# Patient Record
Sex: Female | Born: 1945 | Race: Black or African American | Marital: Single
Health system: Southern US, Community
[De-identification: ages and names within clinical notes are randomized; demographics above are authoritative.]

## PROBLEM LIST (undated history)

## (undated) DIAGNOSIS — R6250 Unspecified lack of expected normal physiological development in childhood: Secondary | ICD-10-CM

## (undated) DIAGNOSIS — I10 Essential (primary) hypertension: Secondary | ICD-10-CM

## (undated) DIAGNOSIS — C50919 Malignant neoplasm of unspecified site of unspecified female breast: Secondary | ICD-10-CM

## (undated) DIAGNOSIS — E118 Type 2 diabetes mellitus with unspecified complications: Secondary | ICD-10-CM

## (undated) DIAGNOSIS — E213 Hyperparathyroidism, unspecified: Secondary | ICD-10-CM

## (undated) DIAGNOSIS — E785 Hyperlipidemia, unspecified: Secondary | ICD-10-CM

## (undated) DIAGNOSIS — E1165 Type 2 diabetes mellitus with hyperglycemia: Secondary | ICD-10-CM

## (undated) DIAGNOSIS — IMO0002 Reserved for concepts with insufficient information to code with codable children: Secondary | ICD-10-CM

## (undated) DIAGNOSIS — K5900 Constipation, unspecified: Secondary | ICD-10-CM

## (undated) DIAGNOSIS — H9190 Unspecified hearing loss, unspecified ear: Secondary | ICD-10-CM

## (undated) DIAGNOSIS — F0390 Unspecified dementia without behavioral disturbance: Secondary | ICD-10-CM

## (undated) HISTORY — PX: OTHER SURGICAL HISTORY: SHX169

---

## 2018-09-15 ENCOUNTER — Encounter: Payer: Self-pay | Admitting: Nurse Practitioner

## 2018-09-15 ENCOUNTER — Other Ambulatory Visit: Payer: Self-pay

## 2018-09-15 ENCOUNTER — Inpatient Hospital Stay
Admission: AD | Admit: 2018-09-15 | Discharge: 2018-09-22 | DRG: 638 | Disposition: A | Discharge status: Skilled Nursing Facility | Payer: Medicare (Managed Care) | Source: Ambulatory Visit | Attending: Internal Medicine | Admitting: Internal Medicine

## 2018-09-15 DIAGNOSIS — K567 Ileus, unspecified: Principal | ICD-10-CM

## 2018-09-15 DIAGNOSIS — R739 Hyperglycemia, unspecified: Secondary | ICD-10-CM

## 2018-09-15 DIAGNOSIS — E11649 Type 2 diabetes mellitus with hypoglycemia without coma: Secondary | ICD-10-CM | POA: Diagnosis present

## 2018-09-15 DIAGNOSIS — H9193 Unspecified hearing loss, bilateral: Secondary | ICD-10-CM | POA: Diagnosis present

## 2018-09-15 DIAGNOSIS — Z9011 Acquired absence of right breast and nipple: Secondary | ICD-10-CM

## 2018-09-15 DIAGNOSIS — R6250 Unspecified lack of expected normal physiological development in childhood: Secondary | ICD-10-CM | POA: Diagnosis present

## 2018-09-15 DIAGNOSIS — E785 Hyperlipidemia, unspecified: Secondary | ICD-10-CM | POA: Diagnosis present

## 2018-09-15 DIAGNOSIS — H409 Unspecified glaucoma: Secondary | ICD-10-CM | POA: Diagnosis present

## 2018-09-15 DIAGNOSIS — E1165 Type 2 diabetes mellitus with hyperglycemia: Principal | ICD-10-CM | POA: Diagnosis present

## 2018-09-15 DIAGNOSIS — N179 Acute kidney failure, unspecified: Secondary | ICD-10-CM | POA: Diagnosis present

## 2018-09-15 DIAGNOSIS — I10 Essential (primary) hypertension: Secondary | ICD-10-CM | POA: Diagnosis present

## 2018-09-15 DIAGNOSIS — R809 Proteinuria, unspecified: Secondary | ICD-10-CM | POA: Diagnosis present

## 2018-09-15 DIAGNOSIS — E871 Hypo-osmolality and hyponatremia: Secondary | ICD-10-CM | POA: Diagnosis present

## 2018-09-15 DIAGNOSIS — N2889 Other specified disorders of kidney and ureter: Secondary | ICD-10-CM | POA: Diagnosis present

## 2018-09-15 DIAGNOSIS — E876 Hypokalemia: Secondary | ICD-10-CM | POA: Diagnosis present

## 2018-09-15 DIAGNOSIS — Z888 Allergy status to other drugs, medicaments and biological substances status: Secondary | ICD-10-CM

## 2018-09-15 DIAGNOSIS — M48061 Spinal stenosis, lumbar region without neurogenic claudication: Secondary | ICD-10-CM | POA: Diagnosis present

## 2018-09-15 DIAGNOSIS — Z853 Personal history of malignant neoplasm of breast: Secondary | ICD-10-CM | POA: Diagnosis not present

## 2018-09-15 DIAGNOSIS — F0390 Unspecified dementia without behavioral disturbance: Secondary | ICD-10-CM | POA: Diagnosis present

## 2018-09-15 DIAGNOSIS — Z1159 Encounter for screening for other viral diseases: Secondary | ICD-10-CM | POA: Diagnosis not present

## 2018-09-15 DIAGNOSIS — Z79899 Other long term (current) drug therapy: Secondary | ICD-10-CM

## 2018-09-15 DIAGNOSIS — Z90710 Acquired absence of both cervix and uterus: Secondary | ICD-10-CM | POA: Diagnosis not present

## 2018-09-15 DIAGNOSIS — R627 Adult failure to thrive: Secondary | ICD-10-CM | POA: Diagnosis present

## 2018-09-15 DIAGNOSIS — K219 Gastro-esophageal reflux disease without esophagitis: Secondary | ICD-10-CM | POA: Diagnosis present

## 2018-09-15 DIAGNOSIS — K8020 Calculus of gallbladder without cholecystitis without obstruction: Secondary | ICD-10-CM | POA: Diagnosis present

## 2018-09-15 DIAGNOSIS — E210 Primary hyperparathyroidism: Secondary | ICD-10-CM | POA: Diagnosis present

## 2018-09-15 DIAGNOSIS — Z9181 History of falling: Secondary | ICD-10-CM

## 2018-09-15 DIAGNOSIS — F89 Unspecified disorder of psychological development: Secondary | ICD-10-CM | POA: Diagnosis present

## 2018-09-15 DIAGNOSIS — E878 Other disorders of electrolyte and fluid balance, not elsewhere classified: Secondary | ICD-10-CM | POA: Diagnosis present

## 2018-09-15 DIAGNOSIS — R269 Unspecified abnormalities of gait and mobility: Secondary | ICD-10-CM | POA: Diagnosis not present

## 2018-09-15 HISTORY — DX: Unspecified lack of expected normal physiological development in childhood: R62.50

## 2018-09-15 HISTORY — DX: Type 2 diabetes mellitus with unspecified complications: E11.8

## 2018-09-15 HISTORY — DX: Hyperlipidemia, unspecified: E78.5

## 2018-09-15 HISTORY — DX: Hyperparathyroidism, unspecified: E21.3

## 2018-09-15 HISTORY — DX: Essential (primary) hypertension: I10

## 2018-09-15 HISTORY — DX: Malignant neoplasm of unspecified site of unspecified female breast: C50.919

## 2018-09-15 HISTORY — DX: Type 2 diabetes mellitus with hyperglycemia: E11.65

## 2018-09-15 HISTORY — DX: Reserved for concepts with insufficient information to code with codable children: IMO0002

## 2018-09-15 LAB — URINALYSIS, COMPLETE (UACMP) WITH MICROSCOPIC
Bacteria, UA: NONE SEEN
Bilirubin Urine: NEGATIVE
Glucose, UA: >=500 mg/dL — AB
Hgb urine dipstick: NEGATIVE
Ketones, ur: 80 mg/dL — AB
Leukocytes,Ua: NEGATIVE
Nitrite: NEGATIVE
Protein, ur: NEGATIVE mg/dL
Specific Gravity, Urine: 1.014 (ref 1.005–1.030)
pH: 5 (ref 5.0–8.0)

## 2018-09-15 LAB — CBC WITH DIFFERENTIAL/PLATELET
Abs Immature Granulocytes: 0.1 10*3/uL — ABNORMAL HIGH (ref 0.00–0.07)
Basophils Absolute: 0 10*3/uL (ref 0.0–0.1)
Basophils Relative: 1 %
Eosinophils Absolute: 0.1 10*3/uL (ref 0.0–0.5)
Eosinophils Relative: 2 %
HCT: 40.9 % (ref 36.0–46.0)
Hemoglobin: 13 g/dL (ref 12.0–15.0)
Immature Granulocytes: 1 %
Lymphocytes Relative: 35 %
Lymphs Abs: 2.6 10*3/uL (ref 0.7–4.0)
MCH: 26.3 pg (ref 26.0–34.0)
MCHC: 31.8 g/dL (ref 30.0–36.0)
MCV: 82.8 fL (ref 80.0–100.0)
Monocytes Absolute: 0.8 10*3/uL (ref 0.1–1.0)
Monocytes Relative: 11 %
Neutro Abs: 3.8 10*3/uL (ref 1.7–7.7)
Neutrophils Relative %: 50 %
Platelets: 295 10*3/uL (ref 150–400)
RBC: 4.94 MIL/uL (ref 3.87–5.11)
RDW: 13.8 % (ref 11.5–15.5)
WBC: 7.5 10*3/uL (ref 4.0–10.5)
nRBC: 0 % (ref 0.0–0.2)

## 2018-09-15 LAB — COMPREHENSIVE METABOLIC PANEL
ALT: 19 U/L (ref 0–44)
AST: 16 U/L (ref 15–41)
Albumin: 4 g/dL (ref 3.5–5.0)
Alkaline Phosphatase: 79 U/L (ref 38–126)
Anion gap: 23 — ABNORMAL HIGH (ref 5–15)
BUN: 23 mg/dL (ref 8–23)
CO2: 16 mmol/L — ABNORMAL LOW (ref 22–32)
Calcium: 10.9 mg/dL — ABNORMAL HIGH (ref 8.9–10.3)
Chloride: 94 mmol/L — ABNORMAL LOW (ref 98–111)
Creatinine, Ser: 1.52 mg/dL — ABNORMAL HIGH (ref 0.44–1.00)
GFR calc Af Amer: 39 mL/min — ABNORMAL LOW (ref >60)
GFR calc non Af Amer: 34 mL/min — ABNORMAL LOW (ref >60)
Glucose, Bld: 385 mg/dL — ABNORMAL HIGH (ref 70–99)
Potassium: 4.6 mmol/L (ref 3.5–5.1)
Sodium: 133 mmol/L — ABNORMAL LOW (ref 135–145)
Total Bilirubin: 1.5 mg/dL — ABNORMAL HIGH (ref 0.3–1.2)
Total Protein: 7.1 g/dL (ref 6.5–8.1)

## 2018-09-15 LAB — MAGNESIUM: Magnesium: 1.7 mg/dL (ref 1.7–2.4)

## 2018-09-15 LAB — GLUCOSE, CAPILLARY
Glucose-Capillary: 353 mg/dL — ABNORMAL HIGH (ref 70–99)
Glucose-Capillary: 406 mg/dL — ABNORMAL HIGH (ref 70–99)
Glucose-Capillary: 328 mg/dL — ABNORMAL HIGH (ref 70–99)

## 2018-09-15 LAB — TSH: TSH: 0.646 u[IU]/mL (ref 0.350–4.500)

## 2018-09-15 MED ORDER — PANTOPRAZOLE SODIUM 40 MG PO TBEC
40.0000 mg | DELAYED_RELEASE_TABLET | Freq: Every day | ORAL | Status: DC
  Administered 2018-09-16 – 2018-09-22 (×7): 40 mg via ORAL
  Filled 2018-09-15 (×7): qty 1

## 2018-09-15 MED ORDER — MAGNESIUM OXIDE 400 (241.3 MG) MG PO TABS
400.0000 mg | ORAL_TABLET | Freq: Two times a day (BID) | ORAL | Status: DC
  Administered 2018-09-15 – 2018-09-22 (×14): 400 mg via ORAL
  Filled 2018-09-15 (×14): qty 1

## 2018-09-15 MED ORDER — INSULIN ASPART 100 UNIT/ML ~~LOC~~ SOLN: 0.0000 [IU] | Freq: Three times a day (TID) | SUBCUTANEOUS | Status: DC

## 2018-09-15 MED ORDER — METFORMIN HCL 500 MG PO TABS
1000.0000 mg | ORAL_TABLET | Freq: Two times a day (BID) | ORAL | Status: DC
  Administered 2018-09-16 – 2018-09-22 (×12): 1000 mg via ORAL
  Filled 2018-09-15 (×15): qty 2

## 2018-09-15 MED ORDER — ASPIRIN EC 81 MG PO TBEC
81.0000 mg | DELAYED_RELEASE_TABLET | Freq: Every day | ORAL | Status: DC
  Administered 2018-09-16 – 2018-09-22 (×7): 81 mg via ORAL
  Filled 2018-09-15 (×7): qty 1

## 2018-09-15 MED ORDER — LATANOPROST 0.005 % OP SOLN
1.0000 [drp] | Freq: Every day | OPHTHALMIC | Status: DC
  Administered 2018-09-15 – 2018-09-21 (×7): 1 [drp] via OPHTHALMIC
  Filled 2018-09-15: qty 2.5

## 2018-09-15 MED ORDER — VITAMIN D 25 MCG (1000 UNIT) PO TABS
1000.0000 [IU] | ORAL_TABLET | Freq: Every day | ORAL | Status: DC
  Administered 2018-09-16 – 2018-09-22 (×7): 1000 [IU] via ORAL
  Filled 2018-09-15 (×7): qty 1

## 2018-09-15 MED ORDER — INSULIN ASPART 100 UNIT/ML ~~LOC~~ SOLN
0.0000 [IU] | Freq: Three times a day (TID) | SUBCUTANEOUS | Status: DC
  Administered 2018-09-15 – 2018-09-16 (×2): 11 [IU] via SUBCUTANEOUS
  Administered 2018-09-16: 12:00:00 15 [IU] via SUBCUTANEOUS
  Administered 2018-09-16: 3 [IU] via SUBCUTANEOUS
  Administered 2018-09-17: 22:00:00 5 [IU] via SUBCUTANEOUS
  Administered 2018-09-17: 12:00:00 8 [IU] via SUBCUTANEOUS
  Administered 2018-09-17: 15 [IU] via SUBCUTANEOUS
  Administered 2018-09-17: 5 [IU] via SUBCUTANEOUS
  Administered 2018-09-18: 17:00:00 8 [IU] via SUBCUTANEOUS
  Administered 2018-09-18: 13:00:00 15 [IU] via SUBCUTANEOUS
  Administered 2018-09-18: 09:00:00 11 [IU] via SUBCUTANEOUS
  Filled 2018-09-15 (×11): qty 1

## 2018-09-15 MED ORDER — LOSARTAN POTASSIUM 25 MG PO TABS
25.0000 mg | ORAL_TABLET | Freq: Every day | ORAL | Status: DC
  Administered 2018-09-17 – 2018-09-22 (×5): 25 mg via ORAL
  Filled 2018-09-15 (×7): qty 1

## 2018-09-15 MED ORDER — POLYETHYLENE GLYCOL 3350 17 G PO PACK
17.0000 g | PACK | Freq: Every day | ORAL | Status: DC
  Administered 2018-09-16 – 2018-09-22 (×7): 17 g via ORAL
  Filled 2018-09-15 (×7): qty 1

## 2018-09-15 MED ORDER — SODIUM CHLORIDE 0.9 % IV SOLN
INTRAVENOUS | Status: DC
  Administered 2018-09-15: 18:00:00 via INTRAVENOUS

## 2018-09-15 MED ORDER — INSULIN ASPART 100 UNIT/ML ~~LOC~~ SOLN
15.0000 [IU] | Freq: Once | SUBCUTANEOUS | Status: AC
  Administered 2018-09-15: 15 [IU] via SUBCUTANEOUS
  Filled 2018-09-15: qty 1

## 2018-09-15 MED ORDER — ENOXAPARIN SODIUM 40 MG/0.4ML ~~LOC~~ SOLN
40.0000 mg | SUBCUTANEOUS | Status: DC
  Administered 2018-09-15 – 2018-09-21 (×7): 40 mg via SUBCUTANEOUS
  Filled 2018-09-15 (×7): qty 0.4

## 2018-09-15 MED ORDER — DORZOLAMIDE HCL-TIMOLOL MAL 2-0.5 % OP SOLN
1.0000 [drp] | Freq: Two times a day (BID) | OPHTHALMIC | Status: DC
  Administered 2018-09-15 – 2018-09-22 (×14): 1 [drp] via OPHTHALMIC
  Filled 2018-09-15: qty 10

## 2018-09-15 NOTE — H&P (Signed)
[place] [facility] [place] NAME: Kim Nunez    MR#:  [id number]  DATE OF BIRTH:  November 09, 1945  DATE OF ADMISSION:  09/15/2018  PRIMARY CARE PHYSICIAN: [place]   REQUESTING/REFERRING PHYSICIAN: Coralie Common, MD  CHIEF COMPLAINT:  No chief complaint on file. Uncontrolled hyperglycemia Nausea and vomiting Generalized weakness Confusion  HISTORY OF PRESENT ILLNESS:  Kim Nunez  is a 73 y.o. female with a known history of type 2 diabetes mellitus with hyperglycemia, hypertension, hyperparathyroidism, hyperlipidemia, developmental delay, breast cancer status post mastectomy of right breast and bilateral hearing loss presenting as a direct admit from [place] with chief complaints of hyperglycemia and electrolyte imbalance per her PCP.  She is not a good historian therefore history mostly obtained from patient's PCP notes in patient's chart.  Per patient's sister who is her POA, patient has history of hypoglycemic episode requiring admission and insulin adjustment.  She was last seen by endocrinologist on 08/2018 for evaluation of uncontrolled hyper/hypoglycemia.  Her blood sugar log at that time revealed fasting blood sugars of 1 46-285, 213 06/21/2007 before lunch and 2 35-4 48 before dinner.  She continued to have hypoglycemic events with blood sugars in the 50s.  Due to repeated hypoglycemic events despite reduction in insulin, her endocrinologist decided to discontinue insulin and give trial of Trulicity.  Patient sister, since starting Trulicity patient has had episode of generalized weakness, nausea, vomiting and stiffness.  Patient is been also having difficulty performing her activities of daily living.  She is usually independent and participate in a pace (program of all inclusive care for the elderly) program through [employer] care.  Patient sister reports that today in the morning patient was confused upon  waking up and unable to get out of the bed requiring 2 person assist.  Patient sister called her PCP advised that patient be admitted for further evaluation.  Per patient's PCP prior lab work had revealed hyperglycemia and hypo-magnesium.  She was therefore admitted for further work-up and management of her uncontrolled blood glucose and possible electrolyte imbalance.  PAST MEDICAL HISTORY:   Past Medical History:  Diagnosis Date   Breast cancer ([place])    Developmental delay    Diabetes mellitus type 2 with complications, uncontrolled ([place])    Hyperlipidemia    Hyperparathyroidism ([place])    Hypertension     PAST SURGICAL HISTORY:   Past Surgical History:  Procedure Laterality Date   right breast masectomy     CATARACT EXTRACTION  Bilateral    MASTECTOMY  Right    BREAST BIOPSY  Right    HYSTERECTOMY      OOPHORECTOMY      GLAUCOMA SURGERY      SOCIAL HISTORY:   Social History   Tobacco Use   Smoking status: Never Smoker   Smokeless tobacco: Never Used  Substance Use Topics   Alcohol use: Never    Frequency: Never    FAMILY HISTORY:  No family history on file.  DRUG ALLERGIES:   Allergies  Allergen Reactions   Atorvastatin Other (See Comments)    Muscle pain Muscle pain     REVIEW OF SYSTEMS:   Unable to obtain ROS from patient, she is developmentally delayed and not a reliable historian  MEDICATIONS AT HOME:   Prior to Admission medications   Not on File   VITAL SIGNS:  Blood pressure 110/67, pulse 84, temperature 98.7 F (37.1 C), temperature source Oral, resp.  rate 16, SpO2 98 %.  PHYSICAL EXAMINATION:   Physical Exam  GENERAL:  73 y.o.-year-old patient lying in the bed with no acute distress.  EYES: Pupils equal, round, reactive to light and accommodation. No scleral icterus. Extraocular muscles intact.  HEENT: Head atraumatic, normocephalic. Oropharynx and nasopharynx clear.  NECK:  Supple, no jugular venous  distention. No thyroid enlargement, no tenderness.  LUNGS: Normal breath sounds bilaterally, no wheezing, rales,rhonchi or crepitation. No use of accessory muscles of respiration.  CARDIOVASCULAR: S1, S2 normal. No murmurs, rubs, or gallops.  ABDOMEN: Soft, nontender, nondistended. Bowel sounds present. No organomegaly or mass.  EXTREMITIES: No pedal edema, cyanosis, or clubbing.  NEUROLOGIC:  PSYCHIATRIC: The patient is alert and oriented x 3.  SKIN: No obvious rash, lesion, or ulcer.   DATA REVIEWED:  LABORATORY PANEL:   CBC Recent Labs  Lab 09/15/18 1747  WBC 7.5  HGB 13.0  HCT 40.9  PLT 295   ------------------------------------------------------------------------------------------------------------------  Chemistries  No results for input(s): NA, K, CL, CO2, GLUCOSE, BUN, CREATININE, CALCIUM, MG, AST, ALT, ALKPHOS, BILITOT in the last 168 hours.  Invalid input(s): GFRCGP ------------------------------------------------------------------------------------------------------------------  Cardiac Enzymes No results for input(s): TROPONINI in the last 168 hours. ------------------------------------------------------------------------------------------------------------------  RADIOLOGY:  No results found.  EKG:  EKG: there are no previous tracings available for comparison.  IMPRESSION AND PLAN:   73 y.o. female with a known history of type 2 diabetes mellitus with hyperglycemia, hypertension, hyperparathyroidism, hyperlipidemia, developmental delay, breast cancer status post mastectomy of right breast and bilateral hearing loss presenting with chief complaints of hyperglycemia and electrolyte imbalance per her PCP who requested a direct admission.  1. Uncontrolled hyperglycemia in a patient with history diabetes mellitus type 2 on long-term insulin. - Admit to medsurg unit - No s/s pf Diabetic Ketoacidosis / Hyperglycemic Hyperosmolar Syndrome  - Check CBC, CMP and  magnesium - UA, Serum Osmolality - IVFs - May need to restart her long acting insulin  2.  Uncontrolled diabetes mellitus type 2- sugars have not been well-controlled on current regimen  - Current HgbA1c 9.3 on 08/22/2018 - Start CBG monitoring and SSI to maintain glucose 140-180 mg/dl - Continue Metformin - Will consider restarting long acting insulin as above - Carb-controlled diet - DM education   3.  Hypo-magnesium - On supplement - Recheck in the am  4. Hypertension - stable - Continue home dose Losartan  5.  DVT prophylaxis -Lovenox subcutaneous  All the records are reviewed and case discussed with ED provider. Management plans discussed with the patient, family and they are in agreement.  CODE STATUS: FULL  TOTAL TIME TAKING CARE OF THIS PATIENT: 50 minutes.    on 09/15/2018 at 6:33 PM  This patient was staffed with Dr. Fritzi Mandes who personally evaluated patient, reviewed documentation and agreed with assessment and plan of care as above.  Rufina Falco, DNP, FNP-BC [facility] Nurse Practitioner Between 7am to 6pm - Pager [phone]  After 6pm go to [email or web address] - password [email or web address] [facility]  Office  [phone]  CC: Primary care physician; [place]

## 2018-09-15 NOTE — Progress Notes (Signed)
Family Meeting Note  Advance Directive yes Today a meeting took place with the  Sister on the phone  Patient being admitted with uncontrolled sugars. She has some developmental disorder. Not the best historian. Discuss code status with patient's sister on the phone. For now patient is a full code. Sister. was going to bring the living will papers.   Time spent 16 mins Fritzi Mandes, MD

## 2018-09-16 ENCOUNTER — Inpatient Hospital Stay: Payer: Medicare (Managed Care)

## 2018-09-16 LAB — BASIC METABOLIC PANEL
Anion gap: 15 (ref 5–15)
BUN: 16 mg/dL (ref 8–23)
CO2: 21 mmol/L — ABNORMAL LOW (ref 22–32)
Calcium: 10.5 mg/dL — ABNORMAL HIGH (ref 8.9–10.3)
Chloride: 102 mmol/L (ref 98–111)
Creatinine, Ser: 1.05 mg/dL — ABNORMAL HIGH (ref 0.44–1.00)
GFR calc Af Amer: >60 mL/min (ref >60)
GFR calc non Af Amer: 53 mL/min — ABNORMAL LOW (ref >60)
Glucose, Bld: 56 mg/dL — ABNORMAL LOW (ref 70–99)
Potassium: 3.4 mmol/L — ABNORMAL LOW (ref 3.5–5.1)
Sodium: 138 mmol/L (ref 135–145)

## 2018-09-16 LAB — HEMOGLOBIN A1C
Hgb A1c MFr Bld: 10.8 % — ABNORMAL HIGH (ref 4.8–5.6)
Hgb A1c MFr Bld: 10.7 % — ABNORMAL HIGH (ref 4.8–5.6)
Mean Plasma Glucose: 263.26 mg/dL
Mean Plasma Glucose: 260.39 mg/dL

## 2018-09-16 LAB — CBC
HCT: 35.9 % — ABNORMAL LOW (ref 36.0–46.0)
Hemoglobin: 11.8 g/dL — ABNORMAL LOW (ref 12.0–15.0)
MCH: 26.2 pg (ref 26.0–34.0)
MCHC: 32.9 g/dL (ref 30.0–36.0)
MCV: 79.6 fL — ABNORMAL LOW (ref 80.0–100.0)
Platelets: 294 10*3/uL (ref 150–400)
RBC: 4.51 MIL/uL (ref 3.87–5.11)
RDW: 13.6 % (ref 11.5–15.5)
WBC: 7.2 10*3/uL (ref 4.0–10.5)
nRBC: 0 % (ref 0.0–0.2)

## 2018-09-16 LAB — MAGNESIUM: Magnesium: 1.6 mg/dL — ABNORMAL LOW (ref 1.7–2.4)

## 2018-09-16 LAB — GLUCOSE, CAPILLARY
Glucose-Capillary: 102 mg/dL — ABNORMAL HIGH (ref 70–99)
Glucose-Capillary: 390 mg/dL — ABNORMAL HIGH (ref 70–99)
Glucose-Capillary: 332 mg/dL — ABNORMAL HIGH (ref 70–99)
Glucose-Capillary: 171 mg/dL — ABNORMAL HIGH (ref 70–99)

## 2018-09-16 MED ORDER — OXYCODONE HCL 5 MG PO TABS
5.0000 mg | ORAL_TABLET | Freq: Four times a day (QID) | ORAL | Status: DC | PRN
  Administered 2018-09-16: 5 mg via ORAL
  Filled 2018-09-16: qty 1

## 2018-09-16 MED ORDER — MAGNESIUM SULFATE 2 GM/50ML IV SOLN
2.0000 g | Freq: Once | INTRAVENOUS | Status: AC
  Administered 2018-09-16: 2 g via INTRAVENOUS
  Filled 2018-09-16: qty 50

## 2018-09-16 MED ORDER — POTASSIUM CHLORIDE CRYS ER 20 MEQ PO TBCR
40.0000 meq | EXTENDED_RELEASE_TABLET | Freq: Once | ORAL | Status: AC
  Administered 2018-09-16: 40 meq via ORAL
  Filled 2018-09-16: qty 2

## 2018-09-16 MED ORDER — ACETAMINOPHEN 325 MG PO TABS
650.0000 mg | ORAL_TABLET | Freq: Four times a day (QID) | ORAL | Status: DC | PRN
  Administered 2018-09-18: 06:00:00 650 mg via ORAL
  Filled 2018-09-16: qty 2

## 2018-09-16 NOTE — Evaluation (Signed)
Physical Therapy Evaluation Patient Details Name: Kim Nunez MRN: [id number] DOB: [birth year] Today's Date: 09/16/2018   History of Present Illness  Pt is a 73 y.o. female presenting to hospital 09/15/18: direct admit for uncontrolled hyperglycemia and electrolyte imbalance.  PMH includes B hearing loss, developmental delay, DM, htn, and breast CA s/p R breast mastectomy.  Clinical Impression  Prior to hospital admission, pt was requiring increased physical assist in the last week but prior to that was ambulatory (family encouraged pt to use RW d/t h/o legs giving out).  Pt lives alone but has support from her 2 sisters and had aide start this past week (assisted pt about 2 hours in the morning).  PT called and spoke with pt's emergency contact/sister Dorothy who provided pt's PLOF and home set-up for therapist (HIPPA maintained); pt's sister voiced concerns regarding the ability of her and her sister being able to keep physically assisting pt.  Currently pt is mod assist semi-supine to sit; mod assist to stand with RW (posterior lean noted in standing); and CGA to min assist to ambulate 60 feet with RW (limited distance per pt reporting feeling "weak")--shuffling gait noted but able to take longer steps with cueing.  Increased time noted for pt to verbally respond and initiate movement.  Pt would benefit from skilled PT to address noted impairments and functional limitations (see below for any additional details).  Upon hospital discharge, d/t current assist levels and concern for appropriate support/assist at home, recommend pt discharge to STR but will monitor pt's status and update recommendations as appropriate.    Follow Up Recommendations SNF    Equipment Recommendations  Rolling walker with 5" wheels;3in1 (PT)    Recommendations for Other Services OT consult     Precautions / Restrictions Precautions Precautions: Fall Restrictions Weight Bearing Restrictions: No      Mobility   Bed Mobility Overal bed mobility: Needs Assistance Bed Mobility: Supine to Sit     Supine to sit: Mod assist;HOB elevated     General bed mobility comments: assist for trunk; increased effort and time for pt to perform  Transfers Overall transfer level: Needs assistance Equipment used: Rolling walker (2 wheeled) Transfers: Sit to/from Stand Sit to Stand: Mod assist  Stand step turn bed to [facility] (no AD) and BSC to recliner (with RW) min assist.         General transfer comment: assist to initiate and come to full stand from bed (x1 trial), BSC (x1 trial), and recliner (x3 trials); posterior lean noted; vc's and tactile cues required for UE and LE placement with transfers  Ambulation/Gait Ambulation/Gait assistance: Min guard;Min assist Gait Distance (Feet): 60 Feet Assistive device: Rolling walker (2 wheeled)   Gait velocity: decreased   General Gait Details: shuffling gait; decreased B step length/foot clearance/heelstrike; occasionally taking longer steps with vc's; occasionally stopping to look around  [employer] Mobility    Modified Rankin (Stroke Patients Only)       Balance Overall balance assessment: Needs assistance Sitting-balance support: No upper extremity supported;Feet supported Sitting balance-Leahy Scale: Good Sitting balance - Comments: steady sitting reaching within BOS   Standing balance support: No upper extremity supported Standing balance-Leahy Scale: Poor Standing balance comment: pt requiring at least single UE support for static standing balance; posterior lean noted with standing requiring min to mod assist to shift weight forward  Pertinent Vitals/Pain Pain Assessment: Faces Faces Pain Scale: No hurt Pain Intervention(s): Limited activity within patient's tolerance;Monitored during session;Repositioned  Vitals (HR and O2 on room air) stable and WFL throughout treatment session.     Home Living Family/patient expects to be discharged to:: Private residence Living Arrangements: Alone Available Help at Discharge: Family;Available PRN/intermittently Type of Home: House Home Access: Ramped entrance     Home Layout: One level Home Equipment: Walker - 2 wheels;Bedside commode;Tub bench Additional Comments: Information obtained from pt's sister    Prior Function Level of Independence: Needs assistance   Gait / Transfers Assistance Needed: Family encouraged pt to use RW d/t h/o legs giving out.  ADL's / Homemaking Assistance Needed: Independent with dressing.  Pt's sister assisted pt with bath 1x/week (otherwise washed up by self on other days).  Recently had aide start in last week (came for about 2 hours in morning).  Comments: Information obtained from pt's sister     Hand Dominance        Extremity/Trunk Assessment   Upper Extremity Assessment Upper Extremity Assessment: Generalized weakness;Difficult to assess due to impaired cognition    Lower Extremity Assessment Lower Extremity Assessment: Generalized weakness;Difficult to assess due to impaired cognition    Cervical / Trunk Assessment Cervical / Trunk Assessment: Normal  Communication   Communication: Other (comment)(Increased time to respond/verbalize)  Cognition Arousal/Alertness: Awake/alert Behavior During Therapy: Flat affect Overall Cognitive Status: No family/caregiver present to determine baseline cognitive functioning                                 General Comments: Oriented to self and hospital; increased time to respond to therapist; tended to focus on various topics during session      General Comments   Nursing cleared pt for participation in physical therapy.  Pt agreeable to PT session.    Exercises  Transfer and gait training   Assessment/Plan    PT Assessment Patient needs continued PT services  PT Problem List Decreased strength;Decreased activity  tolerance;Decreased balance;Decreased mobility;Decreased knowledge of use of DME;Decreased knowledge of precautions       PT Treatment Interventions DME instruction;Gait training;Functional mobility training;Therapeutic activities;Therapeutic exercise;Balance training;Patient/family education    PT Goals (Current goals can be found in the Care Plan section)  Acute Rehab PT Goals Patient Stated Goal: to improve walking PT Goal Formulation: With patient Time For Goal Achievement: 09/30/18 Potential to Achieve Goals: Good    Frequency Min 2X/week   Barriers to discharge Decreased caregiver support      Co-evaluation               AM-PAC PT "6 Clicks" Mobility  Outcome Measure Help needed turning from your back to your side while in a flat bed without using bedrails?: A Little Help needed moving from lying on your back to sitting on the side of a flat bed without using bedrails?: A Lot Help needed moving to and from a bed to a chair (including a wheelchair)?: A Lot Help needed standing up from a chair using your arms (e.g., wheelchair or bedside chair)?: A Lot Help needed to walk in hospital room?: A Little Help needed climbing 3-5 steps with a railing? : A Lot 6 Click Score: 14    End of Session Equipment Utilized During Treatment: Gait belt Activity Tolerance: Patient tolerated treatment well Patient left: in chair;with call bell/phone within reach;with chair alarm set Nurse Communication: Mobility  status;Precautions PT Visit Diagnosis: Other abnormalities of gait and mobility (R26.89);Muscle weakness (generalized) (M62.81);Difficulty in walking, not elsewhere classified (R26.2)    Time: 9969-2493 PT Time Calculation (min) (ACUTE ONLY): 52 min   Charges:   PT Evaluation $PT Eval Low Complexity: 1 Low PT Treatments $Gait Training: 8-22 mins $Therapeutic Activity: 8-22 mins       Leitha Bleak, PT 09/16/18, 12:05 PM [phone]

## 2018-09-16 NOTE — Progress Notes (Signed)
[place] at [place] NAME: Kim Nunez    MR#:  [id number]  DATE OF BIRTH:  May 18, 1945  SUBJECTIVE:  CHIEF COMPLAINT:  No chief complaint on file.  -Patient feels better, sugars are improved, slightly hypoglycemic this morning -Very eager to go home.  Very weak  REVIEW OF SYSTEMS:  Review of Systems  Constitutional: Negative for chills, fever and malaise/fatigue.  HENT: Negative for hearing loss and nosebleeds.   Eyes: Negative for blurred vision and double vision.  Respiratory: Negative for cough, shortness of breath and wheezing.   Cardiovascular: Negative for chest pain, palpitations and leg swelling.  Gastrointestinal: Negative for abdominal pain, constipation, diarrhea, nausea and vomiting.  Genitourinary: Negative for dysuria.  Musculoskeletal: Negative for myalgias.  Neurological: Negative for dizziness, focal weakness, seizures, weakness and headaches.  Psychiatric/Behavioral: Negative for depression.    DRUG ALLERGIES:   Allergies  Allergen Reactions  . Atorvastatin Other (See Comments)    Muscle pain Muscle pain     VITALS:  Blood pressure (!) 117/58, pulse 87, temperature (!) 97.5 F (36.4 C), resp. rate 16, height 5\' 6"  (1.676 m), weight 76.1 kg, SpO2 98 %.  PHYSICAL EXAMINATION:  Physical Exam   GENERAL:  73 y.o.-year-old patient lying in the bed with no acute distress.  EYES: Pupils equal, round, reactive to light and accommodation. No scleral icterus. Extraocular muscles intact.  HEENT: Head atraumatic, normocephalic. Oropharynx and nasopharynx clear.  NECK:  Supple, no jugular venous distention. No thyroid enlargement, no tenderness.  LUNGS: Normal breath sounds bilaterally, no wheezing, rales,rhonchi or crepitation. No use of accessory muscles of respiration.  CARDIOVASCULAR: S1, S2 normal. No murmurs, rubs, or gallops.  ABDOMEN: Soft, nontender, nondistended. Bowel sounds present. No organomegaly or  mass.  EXTREMITIES: No pedal edema, cyanosis, or clubbing.  NEUROLOGIC: Cranial nerves II through XII are intact. Muscle strength 5/5 in all extremities. Sensation intact. Gait not checked.  PSYCHIATRIC: The patient is alert and oriented x 2-3.  SKIN: No obvious rash, lesion, or ulcer.    LABORATORY PANEL:   CBC Recent Labs  Lab 09/16/18 0532  WBC 7.2  HGB 11.8*  HCT 35.9*  PLT 294   ------------------------------------------------------------------------------------------------------------------  Chemistries  Recent Labs  Lab 09/15/18 1747 09/16/18 0532  NA 133* 138  K 4.6 3.4*  CL 94* 102  CO2 16* 21*  GLUCOSE 385* 56*  BUN 23 16  CREATININE 1.52* 1.05*  CALCIUM 10.9* 10.5*  MG 1.7 1.6*  AST 16  --   ALT 19  --   ALKPHOS 79  --   BILITOT 1.5*  --    ------------------------------------------------------------------------------------------------------------------  Cardiac Enzymes No results for input(s): TROPONINI in the last 168 hours. ------------------------------------------------------------------------------------------------------------------  RADIOLOGY:  No results found.  EKG:  No orders found for this or any previous visit.  ASSESSMENT AND PLAN:   73 year old female with developmental delay, type 2 diabetes mellitus, history of breast cancer status post right mastectomy, GERD, primary hyperparathyroidism, bilateral hearing loss presents to hospital secondary to generalized weakness and noted to have hyperglycemia.  1.  Diabetes mellitus with fluctuating blood sugars-recently seen by endocrinology. -Was admitted to the hospital couple of months ago with severe hypoglycemia.  Patient was on Lantus at that time. -At recent visit 3 weeks ago, both Lantus and NovoLog has been discontinued and she was started on Trulicity weekly.  She is also on metformin. -Blood sugar on admission was 385, she received NovoLog injection.  Her blood sugar this  morning is  56.  She has been having fasting low sugars.  Eating is pretty decent. -Diabetes coordinator input pending -  A1c is pending.  Continue metformin for now  2.  Hypokalemia and hypomagnesemia-being replaced  3.  Primary hyperparathyroidism-continue to monitor calcium  4.  Hypertension-on losartan, to help with proteinuria  5.  GERD-Protonix  6.  DVT prophylaxis-Lovenox  Physical therapy consult is pending   All the records are reviewed and case discussed with Care Management/Social Workerr. Management plans discussed with the patient, family and they are in agreement.  CODE STATUS: Full Code  TOTAL TIME TAKING CARE OF THIS PATIENT: 38 minutes.   POSSIBLE D/C IN 1-2 DAYS, DEPENDING ON CLINICAL CONDITION.   Gladstone Lighter M.D on 09/16/2018 at 10:27 AM  Between 7am to 6pm - Pager - [phone]  After 6pm go to [email or web address] - password [email or web address] [facility]  Office  [phone]  CC: Primary care physician; [place]

## 2018-09-16 NOTE — Evaluation (Addendum)
Occupational Therapy Evaluation Patient Details Name: Kim Nunez MRN: [id number] DOB: 07/23/1945 Today's Date: 09/16/2018    History of Present Illness Pt is a 73 y.o. female presenting to hospital 09/15/18: direct admit for uncontrolled hyperglycemia and electrolyte imbalance.  PMH includes B hearing loss, developmental delay, DM, htn, and breast CA s/p R breast mastectomy.   Clinical Impression   Pt. presents with weakness, limited activity tolerance, limited cognitive functioning, and limited functional mobility which hinders her ability to complete basic ADL and IADL functioning. Pt. resides at home alone. Pt.'s sister assists the pt. With morning care tasks. Pt. has recently had home health aides in to assist for 2 hours in the a.m. Pt. requires mod A with cognitive cues for sit to stand from the recliner chair. Pt. requires CGA for sit to stand from higher BSCommode surface at the bathroom toilet using the grab bar, and armrest to assist. Pt. Requires cues, and assist when transitioning from standing to sitting. Pt. was also assisted with changing her gown, and linens due to them being soiled. Additional transfers were performed as pt. was assisted back to bed in preparation to be transported for CT imaging. Pt. Could benefit from OT services for ADL training, A/E training, cognitive compensatory strategies, and pt. Education.    Follow Up Recommendations  SNF    Equipment Recommendations    None   Recommendations for Other Services       Precautions / Restrictions        Mobility Bed Mobility   Bed Mobility: Sit to Supine       Sit to supine: Mod assist      Transfers Overall transfer level: Needs assistance Equipment used: Rolling walker (2 wheeled) Transfers: Sit to/from Stand Sit to Stand: Mod assist         General transfer comment: Mod A from sit to stand from recliner modA, sit to stand from Raised BSCommode CGA. Pt. requires cues for hand placement.  Cognitive cues to navigate to the bathroom, for turning, and for controlled sitting.    Balance                                           ADL either performed or assessed with clinical judgement   ADL Overall ADL's : Needs assistance/impaired Eating/Feeding: Set up   Grooming: Set up;Independent;Sitting;Minimal assistance;Standing   Upper Body Bathing: Set up;Supervision/ safety   Lower Body Bathing: Set up;Minimal assistance   Upper Body Dressing : Set up;Minimal assistance;Sitting   Lower Body Dressing: Set up;Moderate assistance   Toilet Transfer: Minimal assistance Toilet Transfer Details (indicate cue type and reason): Walked from chair to bathroom  minA with cognitive cues. Sit to stand from commode with CGA with cognitive cues, and cues for hand placement Toileting- Clothing Manipulation and Hygiene: Moderate assistance       Functional mobility during ADLs: Minimal assistance       Vision Patient Visual Report: No change from baseline       Perception     Praxis      Pertinent Vitals/Pain Pain Assessment: No/denies pain     Hand Dominance Right   Extremity/Trunk Assessment Upper Extremity Assessment Upper Extremity Assessment: Generalized weakness;Difficult to assess due to impaired cognition           Communication Communication Communication: Other (comment)   Cognition Arousal/Alertness: Awake/alert Behavior During Therapy: Flat  affect Overall Cognitive Status: No family/caregiver present to determine baseline cognitive functioning                                 General Comments: Oriented to self and hospital; increased time to respond to therapist; tended to focus on various topics during session   General Comments       Exercises     Shoulder Instructions      Home Living Family/patient expects to be discharged to:: Private residence Living Arrangements: Alone Available Help at Discharge:  Family;Available PRN/intermittently Type of Home: House       Home Layout: One level     Bathroom Shower/Tub: Teacher, early years/pre: Standard(BSCommode over toilet)     Home Equipment: Environmental consultant - 2 wheels;Bedside commode;Tub bench   Additional Comments: Information obtained from pt's sister      Prior Functioning/Environment Level of Independence: Needs assistance  Gait / Transfers Assistance Needed: Family encouraged pt to use RW 2/2 h/o legs giving out. ADL's / Homemaking Assistance Needed: Independent with dressing.  Pt's sister assisted pt with bath 1x/week (otherwise washed up by self on other days).  Recently had aide start in last week (came for about 2 hours in morning).            OT Problem List: Decreased strength;Decreased cognition;Decreased activity tolerance;Impaired UE functional use;Decreased safety awareness;Decreased coordination      OT Treatment/Interventions: Self-care/ADL training    OT Goals(Current goals can be found in the care plan section) Acute Rehab OT Goals Patient Stated Goal: To regain independence Potential to Achieve Goals: Good  OT Frequency: Min 2X/week   Barriers to D/C:            Co-evaluation              AM-PAC OT "6 Clicks" Daily Activity     Outcome Measure Help from another person eating meals?: None Help from another person taking care of personal grooming?: A Little Help from another person toileting, which includes using toliet, bedpan, or urinal?: A Lot Help from another person bathing (including washing, rinsing, drying)?: A Lot Help from another person to put on and taking off regular upper body clothing?: A Little Help from another person to put on and taking off regular lower body clothing?: A Lot 6 Click Score: 16   End of Session Equipment Utilized During Treatment: Gait belt  Activity Tolerance: Patient tolerated treatment well Patient left: in bed;in chair;with call bell/phone within  reach;with chair alarm set;with bed alarm set  OT Visit Diagnosis: Muscle weakness (generalized) (M62.81);Cognitive communication deficit (R41.841)                Time: 1400-1450 OT Time Calculation (min): 50 min Charges:  OT General Charges $OT Visit: 1 Visit OT Evaluation $OT Eval High Complexity: 1 High  Harrel Carina, MS, OTR/L  Harrel Carina 09/16/2018, 4:01 PM

## 2018-09-16 NOTE — Progress Notes (Signed)
Inpatient Diabetes Program Recommendations  AACE/ADA: New Consensus Statement on Inpatient Glycemic Control (2015)  Target Ranges:  Prepandial:   less than 140 mg/dL      Peak postprandial:   less than 180 mg/dL (1-2 hours)      Critically ill patients:  140 - 180 mg/dL   Lab Results  Component Value Date   GLUCAP 390 (H) 09/16/2018   HGBA1C 10.8 (H) 09/15/2018    Review of Glycemic Control  Diabetes history: DM2 Outpatient Diabetes medications: Trulicity 6.31 weekly, metformin 1000 mg bid Current orders for Inpatient glycemic control: Novolog 0-15 units tidwc and hs, metformin 1000 mg bid  HgbA1C - 10.8%. Up from 9.3% 1 month ago at endo Lantus and Novolog recently d/ced d/t hypoglycemia PT recommending d/c to SNF today. Eating approx 50% meal at present  Inpatient Diabetes Program Recommendations:     Lantus 10 units Q24H Decrease Novolog to 0-9 units tidwc and hs (sensitive to insulin) Add Novolog 3 units tidwc for meal coverage insulin if pt eating > 50% meal.  If pt will be d/ced to home, will need simpler regimen.  Will follow glucose trends. Secure text sent to RN.  Thank you. Lorenda Peck, RD, LDN, CDE Inpatient Diabetes Coordinator [phone]

## 2018-09-16 NOTE — Progress Notes (Signed)
Pt complaining of back pain.  Requested pain medication per text to hospitalist on call. Dorna Bloom RN

## 2018-09-17 ENCOUNTER — Inpatient Hospital Stay: Payer: Medicare (Managed Care)

## 2018-09-17 LAB — GLUCOSE, CAPILLARY
Glucose-Capillary: 354 mg/dL — ABNORMAL HIGH (ref 70–99)
Glucose-Capillary: 271 mg/dL — ABNORMAL HIGH (ref 70–99)
Glucose-Capillary: 205 mg/dL — ABNORMAL HIGH (ref 70–99)
Glucose-Capillary: 219 mg/dL — ABNORMAL HIGH (ref 70–99)

## 2018-09-17 LAB — URINE CULTURE

## 2018-09-17 MED ORDER — INSULIN GLARGINE 100 UNIT/ML ~~LOC~~ SOLN
10.0000 [IU] | Freq: Every day | SUBCUTANEOUS | Status: DC
  Administered 2018-09-17: 08:00:00 10 [IU] via SUBCUTANEOUS
  Filled 2018-09-17 (×2): qty 0.1

## 2018-09-17 MED ORDER — IOHEXOL 300 MG/ML  SOLN
100.0000 mL | Freq: Once | INTRAMUSCULAR | Status: AC | PRN
  Administered 2018-09-17: 100 mL via INTRAVENOUS

## 2018-09-17 NOTE — Plan of Care (Signed)

## 2018-09-17 NOTE — Plan of Care (Signed)
Patient is very heard of hearing. Patient asked for Hearing aid batteries and her glasses, to be brought to her by her sister. She is able to make her needs known.

## 2018-09-17 NOTE — Progress Notes (Signed)
[facility] [place] NAME: Kim Nunez    MR#:  [id number]  DATE OF BIRTH:  12-18-1945  SUBJECTIVE:  CHIEF COMPLAINT:  No chief complaint on file.  -Spoke to patient's sister yesterday, patient has been started back on Lantus at home. -Patient complains of significant abdominal pain and abdomen is slightly distended  REVIEW OF SYSTEMS:  Review of Systems  Constitutional: Negative for chills, fever and malaise/fatigue.  HENT: Positive for hearing loss. Negative for nosebleeds.   Eyes: Negative for blurred vision and double vision.  Respiratory: Negative for cough, shortness of breath and wheezing.   Cardiovascular: Negative for chest pain, palpitations and leg swelling.  Gastrointestinal: Positive for abdominal pain. Negative for constipation, diarrhea, nausea and vomiting.  Genitourinary: Negative for dysuria.  Musculoskeletal: Negative for myalgias.  Neurological: Negative for dizziness, focal weakness, seizures, weakness and headaches.  Psychiatric/Behavioral: Negative for depression.    DRUG ALLERGIES:   Allergies  Allergen Reactions  . Atorvastatin Other (See Comments)    Muscle pain Muscle pain     VITALS:  Blood pressure 128/72, pulse 72, temperature 98.4 F (36.9 C), resp. rate 16, height 5\' 6"  (1.676 m), weight 76.1 kg, SpO2 96 %.  PHYSICAL EXAMINATION:  Physical Exam   GENERAL:  73 y.o.-year-old patient lying in the bed with no acute distress.  Very hard of hearing EYES: Pupils equal, round, reactive to light and accommodation. No scleral icterus. Extraocular muscles intact.  HEENT: Head atraumatic, normocephalic. Oropharynx and nasopharynx clear.  NECK:  Supple, no jugular venous distention. No thyroid enlargement, no tenderness.  LUNGS: Normal breath sounds bilaterally, no wheezing, rales,rhonchi or crepitation. No use of accessory muscles of respiration.  CARDIOVASCULAR: S1, S2 normal. No murmurs, rubs, or  gallops.  ABDOMEN: Soft, but distended, generalized tenderness with voluntary guarding.  No rigidity or rebound tenderness.  Hypoactive bowel sounds present. No organomegaly or mass.  EXTREMITIES: No pedal edema, cyanosis, or clubbing.  NEUROLOGIC: Cranial nerves II through XII are intact. Muscle strength 5/5 in all extremities. Sensation intact. Gait not checked.  PSYCHIATRIC: The patient is alert and oriented x 2-3.  SKIN: No obvious rash, lesion, or ulcer.    LABORATORY PANEL:   CBC Recent Labs  Lab 09/16/18 0532  WBC 7.2  HGB 11.8*  HCT 35.9*  PLT 294   ------------------------------------------------------------------------------------------------------------------  Chemistries  Recent Labs  Lab 09/15/18 1747 09/16/18 0532  NA 133* 138  K 4.6 3.4*  CL 94* 102  CO2 16* 21*  GLUCOSE 385* 56*  BUN 23 16  CREATININE 1.52* 1.05*  CALCIUM 10.9* 10.5*  MG 1.7 1.6*  AST 16  --   ALT 19  --   ALKPHOS 79  --   BILITOT 1.5*  --    ------------------------------------------------------------------------------------------------------------------  Cardiac Enzymes No results for input(s): TROPONINI in the last 168 hours. ------------------------------------------------------------------------------------------------------------------  RADIOLOGY:  Ct Head Wo Contrast  Result Date: 09/16/2018 CLINICAL DATA:  Generalized weakness EXAM: CT HEAD WITHOUT CONTRAST TECHNIQUE: Contiguous axial images were obtained from the base of the skull through the vertex without intravenous contrast. COMPARISON:  None. FINDINGS: Brain: No evidence of acute infarction, hemorrhage, hydrocephalus, extra-axial collection or mass lesion/mass effect. Mild low-density in the deep cerebral white matter attributed to chronic small vessel ischemia. Mild for age cerebral volume loss. Vascular: Atherosclerotic calcification. Skull: No acute or aggressive finding Sinuses/Orbits: Right mastoidectomy for cochlear  implantation (which causes an unavoidable streak artifact at the level of the battery pack). Asymmetric  density in the superficial left orbit, possibly a glaucoma reservoir. IMPRESSION: Senescent changes without acute finding. Electronically Signed   By: Monte Fantasia M.D.   On: 09/16/2018 15:12    EKG:  No orders found for this or any previous visit.  ASSESSMENT AND PLAN:   73 year old female with developmental delay, type 2 diabetes mellitus, history of breast cancer status post right mastectomy, GERD, primary hyperparathyroidism, bilateral hearing loss presents to hospital secondary to generalized weakness and noted to have hyperglycemia.  1.  Diabetes mellitus with fluctuating blood sugars-recently seen by endocrinology. -Was admitted to the hospital couple of months ago with severe hypoglycemia.  Patient was on Lantus at that time. -At recent visit 3 weeks ago, both Lantus and NovoLog has been discontinued and she was started on Trulicity weekly.  Glipizide was discontinued as well. -However patient was having side effects with Trulicity so according to the sister, she did not get her Trulicity it last week and has been restarted on Lantus at 18 units at bedtime and received NovoLog with meals as needed. -She is also on metformin. -Blood sugar on admission was 385, sugars have been fluctuating here as well.  A1c is 10.7 -Restarted on Lantus -Diabetes coordinator input appreciated -We will reach out to patient's endocrinologist tomorrow  2.  Hypokalemia and hypomagnesemia-being replaced  3.  Primary hyperparathyroidism-continue to monitor calcium  4.  Hypertension-on losartan, to help with proteinuria  5.  GERD-Protonix  6.  DVT prophylaxis-Lovenox  7.  Abdominal pain-abdomen is distended.  Follow-up CT abdomen  Physical therapy consulted-recommended rehab at discharge   All the records are reviewed and case discussed with Care Management/Social Workerr. Management plans  discussed with the patient, family and they are in agreement.  CODE STATUS: Full Code  TOTAL TIME TAKING CARE OF THIS PATIENT: 38 minutes.   POSSIBLE D/C IN 1-2 DAYS, DEPENDING ON CLINICAL CONDITION.   Gladstone Lighter M.D on 09/17/2018 at 10:43 AM  Between 7am to 6pm - Pager - [phone]  After 6pm go to [email or web address] - password [email or web address] [place]  Office  [phone]  CC: Primary care physician; [place]

## 2018-09-17 NOTE — Plan of Care (Signed)
Patient is pleasant and cooperative with care. Sitting up in chair at start of shift. Patients sister called, stated she was walking well 2 weeks ago and needing reminders to use her walker due to left sided weakness, and that she was bathing herself, but needing some assistance in shower. Sister states she went downhill 2 weeks ago, and believes Problem: Education: Goal: Knowledge of General Education information will improve Description Including pain rating scale, medication(s)/side effects and non-pharmacologic comfort measures Outcome: Progressing   Problem: Health Behavior/Discharge Planning: Goal: Ability to manage health-related needs will improve Outcome: Progressing   Problem: Clinical Measurements: Goal: Ability to maintain clinical measurements within normal limits will improve Outcome: Progressing Goal: Will remain free from infection Outcome: Progressing Goal: Diagnostic test results will improve Outcome: Progressing Goal: Respiratory complications will improve Outcome: Progressing Goal: Cardiovascular complication will be avoided Outcome: Progressing   Problem: Activity: Goal: Risk for activity intolerance will decrease Outcome: Progressing   Problem: Nutrition: Goal: Adequate nutrition will be maintained Outcome: Progressing   Problem: Coping: Goal: Level of anxiety will decrease Outcome: Progressing   Problem: Elimination: Goal: Will not experience complications related to bowel motility Outcome: Progressing Goal: Will not experience complications related to urinary retention Outcome: Progressing   Problem: Pain Managment: Goal: General experience of comfort will improve Outcome: Progressing   Problem: Safety: Goal: Ability to remain free from injury will improve Outcome: Progressing   Problem: Skin Integrity: Goal: Risk for impaired skin integrity will decrease Outcome: Progressing   it maybe r/t Trulicity. That she had "an allergic reaction to  that once a week medication".

## 2018-09-17 NOTE — Progress Notes (Signed)
Occupational Therapy Treatment Patient Details Name: Kim Nunez MRN: [id number] DOB: 1946/03/04 Today's Date: 09/17/2018    History of present illness Pt is a 73 y.o. female presenting to hospital 09/15/18: direct admit for uncontrolled hyperglycemia and electrolyte imbalance.  PMH includes B hearing loss, developmental delay, DM, htn, and breast CA s/p R breast mastectomy.   OT comments  Pt seen for OT treatment on this date. Upon arrival to room pt awake/alert. Pt seated in room recliner with with PT just finishing session. Pt agreeable to tx. OT assisted with lunch tray on this date, as pt had requested a new meal tray during PT session. OT provided minimal assistance with cutting meat loaf on tray and VCs to encourage pt active participation in task (see ADL section below for details). Pt also assisted with ordering dinner tray as pt has hx of hearing loss/cognitive impairment and has been unable to use room phone to place meal order independently. Pt noted to be easily distracted and required increased time/prompting to complete tasks on this date. Minor VC's provided for safety as pt was noted to take multiple large bites of food in a row. OT reminded pt to swallow chewed food between each bite for safety. Pt verbalized understanding of education provided. Pt making good progress toward goals and continues to benefit from skilled OT services to maximize return to PLOF and minimize risk of future falls, injury, caregiver burden, and readmission. Will continue to follow POC. Discharge recommendation remains appropriate.    Follow Up Recommendations  SNF    Equipment Recommendations       Recommendations for Other Services      Precautions / Restrictions Precautions Precautions: Fall Restrictions Weight Bearing Restrictions: No       Mobility Bed Mobility Overal bed mobility: Needs Assistance Bed Mobility: Supine to Sit     Supine to sit: [facility] elevated;Min assist     General  bed mobility comments: Deferred. Pt up in recliner at start and end of session on this date.   Transfers Overall transfer level: Needs assistance Equipment used: Rolling walker (2 wheeled) Transfers: Sit to/from Stand Sit to Stand: Min assist         General transfer comment:  min A for trunk control, failed to shift forward enough. Completed bed to [facility] (1 trial), BSC to bed (1 trial), bed to chair (1 trial). Required cuing for hand placement, body placement.     Balance Overall balance assessment: Needs assistance Sitting-balance support: No upper extremity supported;Feet supported Sitting balance-Leahy Scale: Good Sitting balance - Comments: steady sitting reaching within BOS during functional task.    Standing balance support: Bilateral upper extremity supported;During functional activity Standing balance-Leahy Scale: Fair Standing balance comment: pt requiring BUE support for static standing balance; backward lean less prominent                           ADL either performed or assessed with clinical judgement   ADL Overall ADL's : Needs assistance/impaired Eating/Feeding: Set up;Minimal assistance;Cueing for sequencing;Cueing for safety Eating/Feeding Details (indicate cue type and reason): Pt assisted with lunch on this date. Pt required min assist for cutting food and tactile cues to utilize BUE during eating task. With this support pt was able to eat a variety of foods from her tray including pears, peaches, meatloaf, and peas. Pt noted to hold fruit cup with LUE and use fork or spoon to eat with RUE. Pt also able  to drink independently from open cup on this date with no spillage noted. Pt required minimal VCs for encouragment to continue attempts to eat and to remind her to swallow a large bite before taking a second.                                          Vision Patient Visual Report: No change from baseline     Perception     Praxis       Cognition Arousal/Alertness: Awake/alert Behavior During Therapy: WFL for tasks assessed/performed Overall Cognitive Status: History of cognitive impairments - at baseline                                 General Comments: Pt at times became distracted and required VCs to attend to self-feeding. Was able to use BUE during self feeding with minimal cuing from therapist to sequence the task.         Exercises Other Exercises Other Exercises: completed toileting at [facility], donning/doffing breif for ambulation at pt request. Required assistance with pericare and garment management.  Other Exercises: Pt assisted with self-feeding on this date. Had re-ordered lunch tray during PT session. OT provided VCs and min assist for mgt of foods on the tray. OT also assisted with placing dinner order as pt is very HOH and has been unable to use the phone to place her order independently.    Shoulder Instructions       General Comments Assisted NT with placement of external catheter on this date.     Pertinent Vitals/ Pain       Pain Assessment: Faces Pain Score: 0-No pain Faces Pain Scale: No hurt  Home Living                                          Prior Functioning/Environment              Frequency  Min 2X/week        Progress Toward Goals  OT Goals(current goals can now be found in the care plan section)  Progress towards OT goals: Progressing toward goals  Acute Rehab OT Goals Patient Stated Goal: to improve walking Potential to Achieve Goals: Good  Plan Discharge plan remains appropriate;Frequency remains appropriate    Co-evaluation                 AM-PAC OT "6 Clicks" Daily Activity     Outcome Measure   Help from another person eating meals?: A Little Help from another person taking care of personal grooming?: A Little Help from another person toileting, which includes using toliet, bedpan, or urinal?: A Lot Help from another  person bathing (including washing, rinsing, drying)?: A Lot Help from another person to put on and taking off regular upper body clothing?: A Little Help from another person to put on and taking off regular lower body clothing?: A Lot 6 Click Score: 15    End of Session    OT Visit Diagnosis: Muscle weakness (generalized) (M62.81);Cognitive communication deficit (R41.841)   Activity Tolerance Patient tolerated treatment well   Patient Left in chair;with call bell/phone within reach;with chair alarm set   Nurse Communication  Time: 1321-1350 OT Time Calculation (min): 29 min  Charges: OT General Charges $OT Visit: 1 Visit OT Treatments $Self Care/Home Management : 23-37 mins  Shara Blazing, M.S., OTR/L Ascom: [phone] 09/17/18, 2:07 PM

## 2018-09-17 NOTE — Progress Notes (Signed)
Physical Therapy Treatment Patient Details Name: Kim Nunez MRN: [id number] DOB: May 19, 1945 Today's Date: 09/17/2018    History of Present Illness Pt is a 73 y.o. female presenting to hospital 09/15/18: direct admit for uncontrolled hyperglycemia and electrolyte imbalance.  PMH includes B hearing loss, developmental delay, DM, htn, and breast CA s/p R breast mastectomy.    PT Comments    Patient tolerated treatment well and is making progress towards goals. She required less assistance today for mobility and ambulated further than previously. Communication is decreased due to pt hard of hearing and limited reading comprehension. Patient appears in good spirits and is cooperative. Patient completed supine to sit with min A, STS x 4 trials to RW with min A, ambulated with RW and MinA - CGA. Demo stooped posture, drifting to the right, unable to push walker evenly so it turns towards right, self corrects when hits objects. Continues with shuffling gait, stopping every few seconds to look around. Required multimodal cuing throughout session to stay on task and complete activities safely. Patient would benefit from continued physical therapy to address remaining impairments and functional limitations to work towards stated goals and return to PLOF or maximal functional independence.     Follow Up Recommendations  SNF     Equipment Recommendations  Rolling walker with 5" wheels;3in1 (PT)    Recommendations for Other Services OT consult     Precautions / Restrictions Precautions Precautions: Fall Restrictions Weight Bearing Restrictions: No    Mobility  Bed Mobility Overal bed mobility: Needs Assistance Bed Mobility: Supine to Sit     Supine to sit: HOB elevated;Min assist     General bed mobility comments: assist for trunk; increased effort and time for pt to perform,  Transfers Overall transfer level: Needs assistance Equipment used: Rolling walker (2 wheeled) Transfers: Sit  to/from Stand Sit to Stand: Min assist         General transfer comment:  min A for trunk control, failed to shift forward enough. Completed bed to [facility] (1 trial), BSC to bed (1 trial), bed to chair (1 trial). Required cuing for hand placement, body placement.   Ambulation/Gait Ambulation/Gait assistance: Min guard;Min assist Gait Distance (Feet): 100 Feet Assistive device: Rolling walker (2 wheeled) Gait Pattern/deviations: Drifts right/left Gait velocity: decreased   General Gait Details: shuffling gait; decreased B step length/foot clearance/heelstrike; occasionally taking longer steps with vc's; frequently stopping to look around, continues to drift R, requires assistance to right RW at times.    Stairs             Wheelchair Mobility    Modified Rankin (Stroke Patients Only)       Balance Overall balance assessment: Needs assistance Sitting-balance support: No upper extremity supported;Feet supported Sitting balance-Leahy Scale: Good Sitting balance - Comments: steady sitting reaching within BOS   Standing balance support: Bilateral upper extremity supported;During functional activity Standing balance-Leahy Scale: Fair Standing balance comment: pt requiring BUE support for static standing balance; backward lean less prominent                            Cognition Arousal/Alertness: Awake/alert Behavior During Therapy: Flat affect Overall Cognitive Status: No family/caregiver present to determine baseline cognitive functioning                                 General Comments:  increased time to respond to  therapist; stated she is hungry and food is cold; requests different food - notified NT; communication hindered by poor hearing, partial reading ability      Exercises Other Exercises Other Exercises: completed toileting at [facility], donning/doffing breif for ambulation at pt request. Required assistance with pericare and garment  management.     General Comments        Pertinent Vitals/Pain Pain Assessment: Faces Pain Score: 0-No pain Faces Pain Scale: No hurt    Home Living                      Prior Function            PT Goals (current goals can now be found in the care plan section) Acute Rehab PT Goals Patient Stated Goal: to improve walking PT Goal Formulation: With patient Time For Goal Achievement: 09/30/18 Potential to Achieve Goals: Good Progress towards PT goals: Progressing toward goals    Frequency    Min 2X/week      PT Plan Current plan remains appropriate    Co-evaluation              AM-PAC PT "6 Clicks" Mobility   Outcome Measure  Help needed turning from your back to your side while in a flat bed without using bedrails?: A Little Help needed moving from lying on your back to sitting on the side of a flat bed without using bedrails?: A Little Help needed moving to and from a bed to a chair (including a wheelchair)?: A Little Help needed standing up from a chair using your arms (e.g., wheelchair or bedside chair)?: A Little Help needed to walk in hospital room?: A Little Help needed climbing 3-5 steps with a railing? : Total 6 Click Score: 16    End of Session Equipment Utilized During Treatment: Gait belt Activity Tolerance: Patient tolerated treatment well;Patient limited by fatigue Patient left: in chair;with call bell/phone within reach;with chair alarm set Nurse Communication: Mobility status;Precautions PT Visit Diagnosis: Other abnormalities of gait and mobility (R26.89);Muscle weakness (generalized) (M62.81);Difficulty in walking, not elsewhere classified (R26.2)     Time: 1062-6948 PT Time Calculation (min) (ACUTE ONLY): 35 min  Charges:  $Gait Training: 8-22 mins $Therapeutic Activity: 8-22 mins                     Everlean Alstrom. Graylon Good, PT, DPT 09/17/18, 1:35 PM

## 2018-09-18 LAB — GLUCOSE, CAPILLARY
Glucose-Capillary: 344 mg/dL — ABNORMAL HIGH (ref 70–99)
Glucose-Capillary: 369 mg/dL — ABNORMAL HIGH (ref 70–99)
Glucose-Capillary: 260 mg/dL — ABNORMAL HIGH (ref 70–99)
Glucose-Capillary: 109 mg/dL — ABNORMAL HIGH (ref 70–99)

## 2018-09-18 LAB — BASIC METABOLIC PANEL
Anion gap: 9 (ref 5–15)
BUN: 12 mg/dL (ref 8–23)
CO2: 23 mmol/L (ref 22–32)
Calcium: 10.2 mg/dL (ref 8.9–10.3)
Chloride: 96 mmol/L — ABNORMAL LOW (ref 98–111)
Creatinine, Ser: 0.94 mg/dL (ref 0.44–1.00)
GFR calc Af Amer: >60 mL/min (ref >60)
GFR calc non Af Amer: >60 mL/min (ref >60)
Glucose, Bld: 250 mg/dL — ABNORMAL HIGH (ref 70–99)
Potassium: 4.3 mmol/L (ref 3.5–5.1)
Sodium: 128 mmol/L — ABNORMAL LOW (ref 135–145)

## 2018-09-18 LAB — MAGNESIUM: Magnesium: 1.7 mg/dL (ref 1.7–2.4)

## 2018-09-18 MED ORDER — ONDANSETRON HCL 4 MG/2ML IJ SOLN
4.0000 mg | Freq: Four times a day (QID) | INTRAMUSCULAR | Status: DC | PRN
  Administered 2018-09-18: 4 mg via INTRAVENOUS
  Filled 2018-09-18: qty 2

## 2018-09-18 MED ORDER — SIMETHICONE 80 MG PO CHEW
80.0000 mg | CHEWABLE_TABLET | Freq: Four times a day (QID) | ORAL | Status: DC
  Administered 2018-09-18 – 2018-09-22 (×18): 80 mg via ORAL
  Filled 2018-09-18 (×20): qty 1

## 2018-09-18 MED ORDER — INSULIN GLARGINE 100 UNIT/ML ~~LOC~~ SOLN
14.0000 [IU] | Freq: Every day | SUBCUTANEOUS | Status: DC
  Filled 2018-09-18: qty 0.14

## 2018-09-18 MED ORDER — BISACODYL 10 MG RE SUPP
10.0000 mg | Freq: Every day | RECTAL | Status: DC | PRN
  Administered 2018-09-18 – 2018-09-21 (×2): 10 mg via RECTAL
  Filled 2018-09-18 (×2): qty 1

## 2018-09-18 MED ORDER — SODIUM CHLORIDE 0.9 % IV SOLN
INTRAVENOUS | Status: DC
  Administered 2018-09-18 (×2): via INTRAVENOUS

## 2018-09-18 MED ORDER — INSULIN GLARGINE 100 UNIT/ML ~~LOC~~ SOLN
16.0000 [IU] | Freq: Every day | SUBCUTANEOUS | Status: DC
  Administered 2018-09-18: 11:00:00 16 [IU] via SUBCUTANEOUS
  Filled 2018-09-18 (×3): qty 0.16

## 2018-09-18 NOTE — Progress Notes (Signed)
Occupational Therapy Treatment Patient Details Name: Kim Nunez MRN: [id number] DOB: 1945-07-01 Today's Date: 09/18/2018    History of present illness Pt is a 73 y.o. female presenting to hospital 09/15/18: direct admit for uncontrolled hyperglycemia and electrolyte imbalance.  PMH includes B hearing loss, developmental delay, DM, htn, and breast CA s/p R breast mastectomy.   OT comments  Pt seen for OT treatment on this date. Upon arrival to room pt awake/alert. Pt upright in bed with breakfast tray. Pt agreeable to tx. OT assisted with breakfast tray on this date and provided minimal assistance with loading fork and positioning utensils for pt to grasp. OT also provided VCs to encourage pt active participation in task (see ADL section below for details). OT and mobility tech assisted pt to [facility] on this date, pt required mod/max assist +2 for short transfers from bed to [facility] and from [facility] to room recliner with RW. Pt also required increased time/effort and VCs for sequencing/safety on this date. This level of assistance needed by this pt appears to be a significant change from prior OT/PT sessions. Per PACE care provider, it is common for this pt fluctuate in level of assistance needed from day to day and she can become stiff without regular mobility. Pt making progress toward goals and continues to benefit from skilled OT services to maximize return to PLOF and minimize risk of future falls, injury, caregiver burden, and readmission. Will continue to follow POC. Discharge recommendation remains appropriate.    Follow Up Recommendations  SNF    Equipment Recommendations  (TBD)    Recommendations for Other Services      Precautions / Restrictions Precautions Precautions: Fall Precaution Comments: Per caregivers, pt easily becomes stiff. Would benefit from some streatching/PROM prior to mobility.  Restrictions Weight Bearing Restrictions: No       Mobility Bed Mobility Overal bed  mobility: Needs Assistance Bed Mobility: Supine to Sit     Supine to sit: HOB elevated;Min assist        Transfers Overall transfer level: Needs assistance Equipment used: Rolling walker (2 wheeled) Transfers: Sit to/from Stand Sit to Stand: +2 physical assistance;Mod assist;Max assist              Balance Overall balance assessment: Needs assistance Sitting-balance support: No upper extremity supported;Feet supported Sitting balance-Leahy Scale: Fair Sitting balance - Comments: Pt noted to have more difficulty maintaining balance on this date. Was able to sith w/o UE support but could not tolerate chalenge and required minimal physical cuing to self-correct L lateral leaning.  Postural control: Left lateral lean   Standing balance-Leahy Scale: Fair Standing balance comment: Pt able to maintain static standing balance during nursing care on this date. Was able to stand for ~3 minutes using RW with support from OT/mobility tech as RN administered suppository.                            ADL either performed or assessed with clinical judgement   ADL Overall ADL's : Needs assistance/impaired Eating/Feeding: Set up;Minimal assistance;Cueing for sequencing;Cueing for safety Eating/Feeding Details (indicate cue type and reason): Pt continues to require min assist for cutting of food and heavy VC's for attention to task while eating. Pt able to hold cup with min assist from OT on this date for positioning hand in handle. Pt regularly requested OT to feed her but was able to feed self with prompting and encouragement.  Toileting- Clothing Manipulation and Hygiene: Maximal assistance;+2 for physical assistance;Sit to/from stand Toileting - Clothing Manipulation Details (indicate cue type and reason): Pt required +2 mod/max assist with VCs for hand placement/sequencing during STS transfers with toileting. Pt unable to complete toilet hygiene on  narrow room BSC and required max assist. Would likely have lower level of assist on a commode w/ wider handles      Functional mobility during ADLs: +2 for physical assistance;Rolling walker;Moderate assistance General ADL Comments: Pt required +2 mod/max assist during functional mobility on this date. Was unable to advance feet independently and rquired physical assist during stand pivot transfrer from bed to [facility]. Demonstrated improved mobility during transfer from [facility] to room recliner.      Vision Baseline Vision/History: Wears glasses Wears Glasses: At all times Patient Visual Report: No change from baseline     Perception     Praxis      Cognition Arousal/Alertness: Awake/alert Behavior During Therapy: WFL for tasks assessed/performed Overall Cognitive Status: History of cognitive impairments - at baseline                                 General Comments: Pt at times became distracted and required VCs to attend to self-feeding. Was able to use BUE during self feeding with consistent cuing from therapist to sequence the task.         Exercises Other Exercises Other Exercises: Pt assisted with self-feeding on this date. OT provided VCs and min assist for mgt of foods/drinks on the tray.    Shoulder Instructions       General Comments      Pertinent Vitals/ Pain       Faces Pain Scale: No hurt  Home Living                                          Prior Functioning/Environment              Frequency  Min 2X/week        Progress Toward Goals  OT Goals(current goals can now be found in the care plan section)  Progress towards OT goals: Progressing toward goals  Acute Rehab OT Goals Patient Stated Goal: to improve walking Potential to Achieve Goals: Good  Plan Discharge plan remains appropriate;Frequency remains appropriate    Co-evaluation                 AM-PAC OT "6 Clicks" Daily Activity     Outcome  Measure   Help from another person eating meals?: A Little Help from another person taking care of personal grooming?: A Little Help from another person toileting, which includes using toliet, bedpan, or urinal?: A Lot Help from another person bathing (including washing, rinsing, drying)?: A Lot Help from another person to put on and taking off regular upper body clothing?: A Little Help from another person to put on and taking off regular lower body clothing?: A Lot 6 Click Score: 15    End of Session Equipment Utilized During Treatment: Gait belt;Rolling walker  OT Visit Diagnosis: Muscle weakness (generalized) (M62.81);Cognitive communication deficit (R41.841)   Activity Tolerance Patient tolerated treatment well   Patient Left in chair;with call bell/phone within reach;with chair alarm set;with nursing/sitter in room   Nurse Communication Mobility status  Time: 5974-1638 OT Time Calculation (min): 43 min  Charges: OT General Charges $OT Visit: 1 Visit OT Treatments $Self Care/Home Management : 38-52 mins  Shara Blazing, M.S., OTR/L Ascom: [phone] 09/18/18, 11:11 AM

## 2018-09-18 NOTE — Progress Notes (Signed)
73 year old female admitted with labile blood sugars.  An abdominal CT was performed on admission which incidentally showed a 5 cm left upper pole mass which does appear solid.  She has developmental delay and is hard of hearing.  Would recommend follow-up in our office after discharge with a family member to discuss her renal mass and management options.  Would also recommend either a renal mass protocol MRI or CT.

## 2018-09-18 NOTE — TOC Initial Note (Signed)
Transition of Care [facility]) - Initial/Assessment Note    Patient Details  Name: Kim Nunez MRN: [id number] Date of Birth: [birth year]  [facility] CM/SW Contact:    Su Hilt, RN Phone Number: 09/18/2018, 2:37 PM  Clinical Narrative:                  Attempted to do assessment, Patient is confused, Sister does not answer the phone       Patient Goals and CMS Choice        Expected Discharge Plan and Services           Expected Discharge Date: 09/17/18                                    Prior Living Arrangements/Services                       Activities of Daily Living Home Assistive Devices/Equipment: Gilford Rile (specify type), Eyeglasses, Hearing aid ADL Screening (condition at time of admission) Patient's cognitive ability adequate to safely complete daily activities?: No Is the patient deaf or have difficulty hearing?: Yes Does the patient have difficulty seeing, even when wearing glasses/contacts?: No Does the patient have difficulty concentrating, remembering, or making decisions?: No Patient able to express need for assistance with ADLs?: Yes Does the patient have difficulty dressing or bathing?: Yes Independently performs ADLs?: No Does the patient have difficulty walking or climbing stairs?: Yes Weakness of Legs: Both Weakness of Arms/Hands: Both  Permission Sought/Granted                  Emotional Assessment              Admission diagnosis:  uncontrolled diabetes Patient Active Problem List   Diagnosis Date Noted  . Hyperglycemia 09/15/2018   PCP:  [place]:   [place], [place] - [address] [place] [address] Phone: [phone] Fax: [phone]     Social Determinants of Health (SDOH) Interventions    Readmission Risk Interventions No flowsheet data found.

## 2018-09-18 NOTE — Progress Notes (Signed)
[facility] [place] NAME: Kim Nunez    MR#:  [id number]  DATE OF BIRTH:  1945-06-10  SUBJECTIVE:  CHIEF COMPLAINT:  No chief complaint on file.  -Sugars remain elevated. -Abdominal pain is improving, CT of the abdomen is reviewed.  No recent bowel movement recorded  REVIEW OF SYSTEMS:  Review of Systems  Constitutional: Negative for chills, fever and malaise/fatigue.  HENT: Positive for hearing loss. Negative for nosebleeds.   Eyes: Negative for blurred vision and double vision.  Respiratory: Negative for cough, shortness of breath and wheezing.   Cardiovascular: Negative for chest pain, palpitations and leg swelling.  Gastrointestinal: Positive for abdominal pain. Negative for constipation, diarrhea, nausea and vomiting.  Genitourinary: Negative for dysuria.  Musculoskeletal: Negative for myalgias.  Neurological: Negative for dizziness, focal weakness, seizures, weakness and headaches.  Psychiatric/Behavioral: Negative for depression.    DRUG ALLERGIES:   Allergies  Allergen Reactions   Atorvastatin Other (See Comments)    Muscle pain Muscle pain     VITALS:  Blood pressure 130/82, pulse 74, temperature 97.8 F (36.6 C), resp. rate 16, height 5\' 6"  (1.676 m), weight 76.1 kg, SpO2 99 %.  PHYSICAL EXAMINATION:  Physical Exam   GENERAL:  73 y.o.-year-old patient lying in the bed with no acute distress.  Very hard of hearing EYES: Pupils equal, round, reactive to light and accommodation. No scleral icterus. Extraocular muscles intact.  HEENT: Head atraumatic, normocephalic. Oropharynx and nasopharynx clear.  NECK:  Supple, no jugular venous distention. No thyroid enlargement, no tenderness.  LUNGS: Normal breath sounds bilaterally, no wheezing, rales,rhonchi or crepitation. No use of accessory muscles of respiration.  CARDIOVASCULAR: S1, S2 normal. No murmurs, rubs, or gallops.  ABDOMEN: Soft, but distended,  generalized tenderness with voluntary guarding.  No rigidity or rebound tenderness.  Hypoactive bowel sounds present. No organomegaly or mass.  EXTREMITIES: No pedal edema, cyanosis, or clubbing.  NEUROLOGIC: Cranial nerves II through XII are intact. Muscle strength 5/5 in all extremities. Sensation intact. Gait not checked.  PSYCHIATRIC: The patient is alert and oriented x 2-3.  SKIN: No obvious rash, lesion, or ulcer.    LABORATORY PANEL:   CBC Recent Labs  Lab 09/16/18 0532  WBC 7.2  HGB 11.8*  HCT 35.9*  PLT 294   ------------------------------------------------------------------------------------------------------------------  Chemistries  Recent Labs  Lab 09/15/18 1747  09/18/18 0518  NA 133*   < > 128*  K 4.6   < > 4.3  CL 94*   < > 96*  CO2 16*   < > 23  GLUCOSE 385*   < > 250*  BUN 23   < > 12  CREATININE 1.52*   < > 0.94  CALCIUM 10.9*   < > 10.2  MG 1.7   < > 1.7  AST 16  --   --   ALT 19  --   --   ALKPHOS 79  --   --   BILITOT 1.5*  --   --    < > = values in this interval not displayed.   ------------------------------------------------------------------------------------------------------------------  Cardiac Enzymes No results for input(s): TROPONINI in the last 168 hours. ------------------------------------------------------------------------------------------------------------------  RADIOLOGY:  Ct Head Wo Contrast  Result Date: 09/16/2018 CLINICAL DATA:  Generalized weakness EXAM: CT HEAD WITHOUT CONTRAST TECHNIQUE: Contiguous axial images were obtained from the base of the skull through the vertex without intravenous contrast. COMPARISON:  None. FINDINGS: Brain: No evidence of acute infarction, hemorrhage, hydrocephalus, extra-axial collection  or mass lesion/mass effect. Mild low-density in the deep cerebral white matter attributed to chronic small vessel ischemia. Mild for age cerebral volume loss. Vascular: Atherosclerotic calcification. Skull:  No acute or aggressive finding Sinuses/Orbits: Right mastoidectomy for cochlear implantation (which causes an unavoidable streak artifact at the level of the battery pack). Asymmetric density in the superficial left orbit, possibly a glaucoma reservoir. IMPRESSION: Senescent changes without acute finding. Electronically Signed   By: Monte Fantasia M.D.   On: 09/16/2018 15:12   Ct Abdomen Pelvis W Contrast  Result Date: 09/17/2018 CLINICAL DATA:  Abdominal distention with nausea and vomiting EXAM: CT ABDOMEN AND PELVIS WITH CONTRAST TECHNIQUE: Multidetector CT imaging of the abdomen and pelvis was performed using the standard protocol following bolus administration of intravenous contrast. CONTRAST:  168mL OMNIPAQUE IOHEXOL 300 MG/ML  SOLN COMPARISON:  None. FINDINGS: Lower chest: Prominent bronchial lymph nodes size at the lung bases, not definitively pathologic based on coverage. Coronary atherosclerosis. No pericardial effusion. Hepatobiliary: No focal hepatic lesion. Gallbladder without evidence of inflammation. There is a Phrygian cap with small associated calcification. Pancreas: Unremarkable. Spleen: Unremarkable. Adrenals/Urinary Tract: Negative adrenals. Bilateral renal cortical scarring. There is a heterogeneous (mainly low-density) but with areas of calcification and possible enhancement) mass in the upper pole left kidney measuring 4.9 cm. This was not described on a 2006 abdominal CT at [facility]. Unremarkable bladder. Stomach/Bowel: The colon is diffusely dilated by gas and stool. There is rectal stool which is not convincingly obstructive. No appendicitis. No small bowel obstruction. Vascular/Lymphatic: No acute vascular abnormality. No mass or adenopathy. Reproductive:Minimal calcification in the uterus likely reflecting a fibroid. Other: No ascites or pneumoperitoneum. Musculoskeletal: No acute abnormalities. Lumbar facet degeneration with L4-5 anterolisthesis. Advanced L5-S1 disc degeneration.  IMPRESSION: 1. Diffuse distention of the colon by gas and stool-possible colonic ileus. 2. 4.9 cm left renal mass that is complex and likely solid-new from a 2006 CT report at [facility]. Recommend renal MRI. 3. Distended gallbladder without evidence of acute inflammation. Electronically Signed   By: Monte Fantasia M.D.   On: 09/17/2018 14:18    EKG:  No orders found for this or any previous visit.  ASSESSMENT AND PLAN:   73 year old female with developmental delay, type 2 diabetes mellitus, history of breast cancer status post right mastectomy, GERD, primary hyperparathyroidism, bilateral hearing loss presents to hospital secondary to generalized weakness and noted to have hyperglycemia.  1.  Diabetes mellitus with fluctuating blood sugars-recently seen by endocrinology. -Was admitted to the hospital couple of months ago with severe hypoglycemia.  Patient was on Lantus at that time. -At recent visit 3 weeks ago, both Lantus and NovoLog has been discontinued and she was started on Trulicity weekly.  Glipizide was discontinued as well. -However patient was having side effects with Trulicity so according to the sister, she did not get her Trulicity it last week and has been restarted on Lantus at 18 units at bedtime and received NovoLog with meals as needed. -She is also on metformin. -Blood sugar on admission was 385, sugars have been fluctuating here as well.  A1c is 10.7 -Restarted on Lantus-slowly increasing at this time. -Diabetes coordinator input appreciated -Discussed case with Wynonia Hazard, nurse practitioner at pace program who has been helping patient manage the blood sugars lately.  2.  Hypokalemia and hypomagnesemia-being replaced Still hyponatremic.  Added some IV fluids today  3.  Primary hyperparathyroidism-continue to monitor calcium  4.  Hypertension-on losartan, to help with proteinuria  5.  GERD-Protonix  6.  DVT prophylaxis-Lovenox  7.  Abdominal pain-CT of the abdomen  showing colonic ileus.  Change to clear liquid diet.  Added simethicone and suppositories as needed.  Will try to avoid NG tube if possible.  8.  Complex left renal mass-found incidentally on CT.  Will check with urology.  Physical therapy consulted-recommended rehab at discharge   All the records are reviewed and case discussed with Care Management/Social Workerr. Management plans discussed with the patient, family and they are in agreement.  CODE STATUS: Full Code  TOTAL TIME TAKING CARE OF THIS PATIENT: 38 minutes.   POSSIBLE D/C IN 1-2 DAYS, DEPENDING ON CLINICAL CONDITION.   Gladstone Lighter M.D on 09/18/2018 at 9:00 AM  Between 7am to 6pm - Pager - [phone]  After 6pm go to [email or web address] - password [email or web address] [facility]  Office  [phone]  CC: Primary care physician; [place]

## 2018-09-18 NOTE — TOC Initial Note (Signed)
Transition of Care [facility]) - Initial/Assessment Note    Patient Details  Name: Kim Nunez MRN: [id number] Date of Birth: Mar 26, 1946  Transition of Care [facility]) CM/SW Contact:    Su Hilt, RN Phone Number: 09/18/2018, 3:00 PM  Clinical Narrative:                 Patient is confused and very Hard of hearing Her sister did not answer the phone, I spoke with Denisha the nurse with [facility] to get a back ground and Baseline, She stated that the patient is not usually confused but is very hard of hearing, she does live alone and her sisters live nearby and take turns checking on her.  [facility] would like to avoid a SNF admission at [place] has in house physical therapy and would not  Use Home health PT.  She will talk with the [facility] provider and call me back.   Expected Discharge Plan: Skilled Nursing Facility Barriers to Discharge: Continued Medical Work up   Patient Goals and CMS Choice Patient states their goals for this hospitalization and ongoing recovery are:: unable to assess      Expected Discharge Plan and Services Expected Discharge Plan: [facility]   Discharge Planning Services: CM Consult   Living arrangements for the past 2 months: Single Family Home Expected Discharge Date: 09/17/18                                    Prior Living Arrangements/Services Living arrangements for the past 2 months: Single Family Home Lives with:: Self Patient language and need for interpreter reviewed:: No Do you feel safe going back to the place where you live?: Yes      Need for Family Participation in Patient Care: Yes (Comment) Care giver support system in place?: Yes (comment)   Criminal Activity/Legal Involvement Pertinent to Current Situation/Hospitalization: No - Comment as needed  Activities of Daily Living Home Assistive Devices/Equipment: Environmental consultant (specify type), Eyeglasses, Hearing aid ADL Screening (condition at time of admission) Patient's  cognitive ability adequate to safely complete daily activities?: No Is the patient deaf or have difficulty hearing?: Yes Does the patient have difficulty seeing, even when wearing glasses/contacts?: No Does the patient have difficulty concentrating, remembering, or making decisions?: No Patient able to express need for assistance with ADLs?: Yes Does the patient have difficulty dressing or bathing?: Yes Independently performs ADLs?: No Does the patient have difficulty walking or climbing stairs?: Yes Weakness of Legs: Both Weakness of Arms/Hands: Both  Permission Sought/Granted                  Emotional Assessment Appearance:: Appears stated age Attitude/Demeanor/Rapport: Unable to Assess Affect (typically observed): Unable to Assess Orientation: : Oriented to Self Alcohol / Substance Use: Never Used Psych Involvement: No (comment)  Admission diagnosis:  uncontrolled diabetes Patient Active Problem List   Diagnosis Date Noted  . Hyperglycemia 09/15/2018   PCP:  [place]:   [place], [place] - [address] [place] [address] Phone: [phone] Fax: [phone]     Social Determinants of Health (SDOH) Interventions    Readmission Risk Interventions No flowsheet data found.

## 2018-09-18 NOTE — Progress Notes (Signed)
Inpatient Diabetes Program Recommendations  AACE/ADA: New Consensus Statement on Inpatient Glycemic Control (2015)  Target Ranges:  Prepandial:   less than 140 mg/dL      Peak postprandial:   less than 180 mg/dL (1-2 hours)      Critically ill patients:  140 - 180 mg/dL   Lab Results  Component Value Date   GLUCAP 344 (H) 09/18/2018   HGBA1C 10.7 (H) 09/16/2018    Review of Glycemic Control Results for Kim Nunez, Kim Nunez (MRN [id number]) as of 09/18/2018 09:29  Ref. Range 09/17/2018 07:36 09/17/2018 11:47 09/17/2018 16:46 09/17/2018 21:29 09/18/2018 07:58  Glucose-Capillary Latest Ref Range: 70 - 99 mg/dL 354 (H) 271 (H) 205 (H) 219 (H) 344 (H)   Diabetes history: DM 2 Outpatient Diabetes medications:  Trulicity 0.97 weekly, Metformin 1000 mg bid Current orders for Inpatient glycemic control:  Novolog moderate tid with meals and HS Lantus 16 units daily Metformin 1000 mg bid Inpatient Diabetes Program Recommendations:    Per chart review, patient was seen by Malissa Hippo, NP on 08/22/18 due to hypoglycemia.  Patient had several low blood sugars with unknown specific cause.  Therefore patient was switched to Trulicity and insulin was stopped.  Prior to insulin being stopped, her regimen was Lantus 21 units daily in the morning and Novolog 2 units with lunch.   It does appear that patient needs insulin to prevent high blood sugars. Agree with increase in Lantus. Will likely need titration based on fasting CBG's.  Note that plan is for patient to go to SNF.  She will need f/u with endocrinology regarding safe d/c plan back to home after SNF when this is indicated.   Thanks,  Adah Perl, RN, BC-ADM Inpatient Diabetes Coordinator Pager [phone] (8a-5p)

## 2018-09-18 NOTE — Progress Notes (Signed)
Physical Therapy Treatment Patient Details Name: Kim Nunez MRN: [id number] DOB: [birth year] Today's Date: 09/18/2018    History of Present Illness Pt is a 73 y.o. female presenting to hospital 09/15/18: direct admit for uncontrolled hyperglycemia and electrolyte imbalance.  PMH includes B hearing loss, developmental delay, DM, htn, and breast CA s/p R breast mastectomy.    PT Comments    Patient performs sit to stand x 4 reps from recliner chair to stand with RW and min assist. She ambulates 100 feet with path deviation to right and assist to correct her placement of RW and decreased gait speed. She is HOH and difficult to communicate safety and cues for staying inside the AD. She will continue to benefit from skilled PT and will also benefit from HHPT when DC from the hospital.   Follow Up Recommendations        Equipment Recommendations  Rolling walker with 5" wheels    Recommendations for Other Services       Precautions / Restrictions      Mobility  Bed Mobility               General bed mobility comments: ( not tested due to being in the recliner)  Transfers Overall transfer level: Needs assistance Equipment used: Rolling walker (2 wheeled) Transfers: Sit to/from Stand Sit to Stand: Min guard            Ambulation/Gait Ambulation/Gait assistance: Min guard Gait Distance (Feet): 100 Feet Assistive device: Rolling walker (2 wheeled) Gait Pattern/deviations: Step-to pattern         Stairs             Wheelchair Mobility    Modified Rankin (Stroke Patients Only)       Balance Overall balance assessment: Needs assistance Sitting-balance support: No upper extremity supported;Feet supported Sitting balance-Leahy Scale: Fair Sitting balance - Comments: Pt noted to have more difficulty maintaining balance on this date. Was able to sith w/o UE support but could not tolerate chalenge and required minimal physical cuing to self-correct L lateral  leaning.  Postural control: Left lateral lean   Standing balance-Leahy Scale: Fair Standing balance comment: Pt able to maintain static standing balance during nursing care on this date. Was able to stand for ~3 minutes using RW with support from OT/mobility tech as RN administered suppository.                             Cognition Arousal/Alertness: Awake/alert Behavior During Therapy: WFL for tasks assessed/performed Overall Cognitive Status: History of cognitive impairments - at baseline                                 General Comments: Pt at times became distracted and required VCs to attend to self-feeding. Was able to use BUE during self feeding with consistent cuing from therapist to sequence the task.       Exercises      General Comments        Pertinent Vitals/Pain      Home Living                      Prior Function            PT Goals (current goals can now be found in the care plan section) Acute Rehab PT Goals Patient Stated Goal: to improve walking  PT Goal Formulation: With patient Time For Goal Achievement: 10/02/18 Potential to Achieve Goals: Fair Progress towards PT goals: Progressing toward goals    Frequency    Min 2X/week      PT Plan Current plan remains appropriate    Co-evaluation              AM-PAC PT "6 Clicks" Mobility   Outcome Measure  Help needed turning from your back to your side while in a flat bed without using bedrails?: A Little Help needed moving from lying on your back to sitting on the side of a flat bed without using bedrails?: None Help needed moving to and from a bed to a chair (including a wheelchair)?: A Little Help needed standing up from a chair using your arms (e.g., wheelchair or bedside chair)?: A Little Help needed to walk in hospital room?: A Little Help needed climbing 3-5 steps with a railing? : A Little 6 Click Score: 19    End of Session Equipment Utilized  During Treatment: Gait belt Activity Tolerance: Patient tolerated treatment well     PT Visit Diagnosis: Other abnormalities of gait and mobility (R26.89);Muscle weakness (generalized) (M62.81);Difficulty in walking, not elsewhere classified (R26.2)     Time: 1300-1330 PT Time Calculation (min) (ACUTE ONLY): 30 min  Charges:  $Gait Training: 8-22 mins $Therapeutic Activity: 8-22 mins                  Arelia Sneddon S,PT DPT 09/18/2018, 3:01 PM

## 2018-09-18 NOTE — Care Management Important Message (Signed)
Important Message  Patient Details  Name: Kim Nunez MRN: [id number] Date of Birth: [birth year]   Medicare Important Message Given:  Yes    Juliann Pulse A Traeger Sultana 09/18/2018, 10:40 AM

## 2018-09-19 ENCOUNTER — Inpatient Hospital Stay: Payer: Medicare (Managed Care)

## 2018-09-19 LAB — NOVEL CORONAVIRUS, NAA (HOSP ORDER, SEND-OUT TO REF LAB; TAT 18-24 HRS): SARS-CoV-2, NAA: NOT DETECTED

## 2018-09-19 LAB — BASIC METABOLIC PANEL
Anion gap: 10 (ref 5–15)
BUN: 11 mg/dL (ref 8–23)
CO2: 22 mmol/L (ref 22–32)
Calcium: 10.2 mg/dL (ref 8.9–10.3)
Chloride: 102 mmol/L (ref 98–111)
Creatinine, Ser: 0.77 mg/dL (ref 0.44–1.00)
GFR calc Af Amer: >60 mL/min (ref >60)
GFR calc non Af Amer: >60 mL/min (ref >60)
Glucose, Bld: 60 mg/dL — ABNORMAL LOW (ref 70–99)
Potassium: 4.7 mmol/L (ref 3.5–5.1)
Sodium: 134 mmol/L — ABNORMAL LOW (ref 135–145)

## 2018-09-19 LAB — GLUCOSE, CAPILLARY
Glucose-Capillary: 70 mg/dL (ref 70–99)
Glucose-Capillary: 253 mg/dL — ABNORMAL HIGH (ref 70–99)
Glucose-Capillary: 289 mg/dL — ABNORMAL HIGH (ref 70–99)
Glucose-Capillary: 70 mg/dL (ref 70–99)

## 2018-09-19 MED ORDER — ACETAMINOPHEN 325 MG PO TABS: 650.0000 mg | ORAL_TABLET | Freq: Four times a day (QID) | ORAL | Status: DC | PRN

## 2018-09-19 MED ORDER — INSULIN ASPART 100 UNIT/ML ~~LOC~~ SOLN: 0.0000 [IU] | Freq: Every day | SUBCUTANEOUS | Status: DC

## 2018-09-19 MED ORDER — INSULIN GLARGINE 100 UNIT/ML ~~LOC~~ SOLN
15.0000 [IU] | Freq: Every day | SUBCUTANEOUS | Status: DC
  Administered 2018-09-19 – 2018-09-21 (×3): 15 [IU] via SUBCUTANEOUS
  Filled 2018-09-19 (×3): qty 0.15

## 2018-09-19 MED ORDER — INSULIN ASPART 100 UNIT/ML ~~LOC~~ SOLN
0.0000 [IU] | Freq: Three times a day (TID) | SUBCUTANEOUS | Status: DC
  Administered 2018-09-19 (×2): 5 [IU] via SUBCUTANEOUS
  Administered 2018-09-20: 7 [IU] via SUBCUTANEOUS
  Administered 2018-09-20: 13:00:00 5 [IU] via SUBCUTANEOUS
  Administered 2018-09-21: 09:00:00 2 [IU] via SUBCUTANEOUS
  Administered 2018-09-21: 17:00:00 1 [IU] via SUBCUTANEOUS
  Administered 2018-09-21: 3 [IU] via SUBCUTANEOUS
  Administered 2018-09-22: 12:00:00 5 [IU] via SUBCUTANEOUS
  Filled 2018-09-19 (×8): qty 1

## 2018-09-19 NOTE — Progress Notes (Addendum)
Inpatient Diabetes Program Recommendations  AACE/ADA: New Consensus Statement on Inpatient Glycemic Control (2015)  Target Ranges:  Prepandial:   less than 140 mg/dL      Peak postprandial:   less than 180 mg/dL (1-2 hours)      Critically ill patients:  140 - 180 mg/dL   Lab Results  Component Value Date   GLUCAP 70 09/19/2018   HGBA1C 10.7 (H) 09/16/2018    Review of Glycemic Control Results for Kim Nunez, Kim Nunez (MRN [id number]) as of 09/19/2018 09:19  Ref. Range 09/18/2018 07:58 09/18/2018 11:39 09/18/2018 16:42 09/18/2018 21:19 09/19/2018 07:34  Glucose-Capillary Latest Ref Range: 70 - 99 mg/dL 344 (H) 369 (H) 260 (H) 109 (H) 70  Diabetes history: DM 2 Outpatient Diabetes medications:  Trulicity 8.26 weekly, Metformin 1000 mg bid Current orders for Inpatient glycemic control:  Novolog moderate tid with meals and HS Lantus 16 units daily Metformin 1000 mg bid Inpatient Diabetes Program Recommendations:     Note that patient received a total of 34 units of Novolog correction on 09/18/18 which could have contributed to low blood sugar this morning.  Consider reducing to sensitive correction tid with meals. Needs f/u with endocrinology ASAP.    Thanks,  Adah Perl, RN, BC-ADM Inpatient Diabetes Coordinator Pager [phone] (8a-5p)  Addendum: [phone]- Attempted to call [facility] RN regarding patient and DM management.  Left message for call back.

## 2018-09-19 NOTE — Progress Notes (Signed)
Occupational Therapy Treatment Patient Details Name: Kim Nunez MRN: [id number] DOB: Apr 04, 1946 Today's Date: 09/19/2018    History of present illness Pt is a 73 y.o. female presenting to hospital 09/15/18: direct admit for uncontrolled hyperglycemia and electrolyte imbalance.  PMH includes B hearing loss, developmental delay, DM, htn, and breast CA s/p R breast mastectomy.   OT comments  Pt. required ModA with hand-over-hand assist, and cues for encouragement for self-feeding with the right hand, secondary to IV issues with the left. Complete set-up was required. Pt. is HOH, and has hearing aides. Pt. requires increased assist today secondary to IV issues in the left UE and reluctance to use or engage it during the tasks. Pt. could benefit from OT services for ADL training, A/E training, and pt. education about compensatory strategies, work simplification, and safety during ADLs. Pt. Continues to be appropriate for SNF level of care, and follow-up OT services.   Follow Up Recommendations  SNF    Equipment Recommendations       Recommendations for Other Services      Precautions / Restrictions Precautions Precautions: Fall Precaution Comments: Per caregivers, pt easily becomes stiff. Would benefit from some streatching/PROM prior to mobility.  Restrictions Weight Bearing Restrictions: No       Mobility Bed Mobility    Pt. Required assist with repositioning              Transfers    Pt. Seen at bed level                  Balance                                           ADL either performed or assessed with clinical judgement   ADL   Eating/Feeding: Set up;Cueing for sequencing;Cueing for safety;Moderate assistance Eating/Feeding Details (indicate cue type and reason): Pt. required Mod hand-over-hand assist with the right hand  with cues, and encouragement. Pt. not using her LUE secondary to IV issues. Grooming: Set up; Mod A   Upper  Body Bathing: Moderate assistance   Lower Body Bathing: Moderate assistance   Upper Body Dressing : Moderate assistance   Lower Body Dressing: Set up;Moderate assistance                       Vision       Perception     Praxis      Cognition Arousal/Alertness: Awake/alert Behavior During Therapy: WFL for tasks assessed/performed Overall Cognitive Status: History of cognitive impairments - at baseline                                          Exercises     Shoulder Instructions       General Comments      Pertinent Vitals/ Pain       Pain Assessment: No/denies pain  Home Living                                          Prior Functioning/Environment              Frequency  Min 2X/week  Progress Toward Goals  OT Goals(current goals can now be found in the care plan section)  Progress towards OT goals: Progressing toward goals  Acute Rehab OT Goals Patient Stated Goal: To get better Potential to Achieve Goals: Good  Plan Discharge plan remains appropriate;Frequency remains appropriate    Co-evaluation                 AM-PAC OT "6 Clicks" Daily Activity     Outcome Measure   Help from another person eating meals?: A Lot Help from another person taking care of personal grooming?: A Lot Help from another person toileting, which includes using toliet, bedpan, or urinal?: A Lot Help from another person bathing (including washing, rinsing, drying)?: A Lot Help from another person to put on and taking off regular upper body clothing?: A Lot Help from another person to put on and taking off regular lower body clothing?: A Lot 6 Click Score: 12    End of Session    OT Visit Diagnosis: Muscle weakness (generalized) (M62.81);Cognitive communication deficit (R41.841)   Activity Tolerance Patient tolerated treatment well   Patient Left in bed;with bed alarm set   Nurse Communication           Time: 1110-1135 OT Time Calculation (min): 25 min  Charges: OT General Charges $OT Visit: 1 Visit OT Treatments $Self Care/Home Management : 23-37 mins  Harrel Carina, MS, OTR/L  Harrel Carina 09/19/2018, 1:44 PM

## 2018-09-19 NOTE — Progress Notes (Signed)
[facility] [place] NAME: Kim Nunez    MR#:  [id number]  DATE OF BIRTH:  [birth year]  SUBJECTIVE:  CHIEF COMPLAINT:  No chief complaint on file.  -Sugars were in the 60s this morning, patient was downgraded to clear liquid diet yesterday which could have contributed. 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  HENT: Positive for hearing loss. Negative for nosebleeds.   Eyes: Negative for blurred vision and double vision.  Respiratory: Negative for cough, shortness of breath and wheezing.   Cardiovascular: Negative for chest pain, palpitations and leg swelling.  Gastrointestinal: Positive for abdominal pain. Negative for constipation, diarrhea, nausea and vomiting.  Genitourinary: Negative for dysuria.  Musculoskeletal: Negative for myalgias.  Neurological: Negative for dizziness, focal weakness, seizures, weakness and headaches.  Psychiatric/Behavioral: Negative for depression.    DRUG ALLERGIES:   Allergies  Allergen Reactions   Atorvastatin Other (See Comments)    Muscle pain Muscle pain     VITALS:  Blood pressure 107/60, pulse 73, temperature (!) 97.5 F (36.4 C), temperature source Oral, resp. rate 18, height 5\' 6"  (1.676 m), weight 76.1 kg, SpO2 100 %.  PHYSICAL EXAMINATION:  Physical Exam   GENERAL:  73 y.o.-year-old patient lying in the bed with no acute distress.  Very hard of hearing EYES: Pupils equal, round, reactive to light and accommodation. No scleral icterus. Extraocular muscles intact.  HEENT: Head atraumatic, normocephalic. Oropharynx and nasopharynx clear.  NECK:  Supple, no jugular venous distention. No thyroid enlargement, no tenderness.  LUNGS: Normal breath sounds bilaterally, no wheezing, rales,rhonchi or crepitation. No use of accessory muscles of respiration.  CARDIOVASCULAR: S1, S2 normal. No  murmurs, rubs, or gallops.  ABDOMEN: Soft, improved distention, nontender.  No rigidity or rebound tenderness.  Good bowel sounds present. No organomegaly or mass.  EXTREMITIES: No pedal edema, cyanosis, or clubbing.  NEUROLOGIC: Cranial nerves II through XII are intact. Muscle strength 5/5 in all extremities. Sensation intact. Gait not checked.  PSYCHIATRIC: The patient is alert and oriented x 2-3.  SKIN: No obvious rash, lesion, or ulcer.    LABORATORY PANEL:   CBC Recent Labs  Lab 09/16/18 0532  WBC 7.2  HGB 11.8*  HCT 35.9*  PLT 294   ------------------------------------------------------------------------------------------------------------------  Chemistries  Recent Labs  Lab 09/15/18 1747  09/18/18 0518 09/19/18 0407  NA 133*   < > 128* 134*  K 4.6   < > 4.3 4.7  CL 94*   < > 96* 102  CO2 16*   < > 23 22  GLUCOSE 385*   < > 250* 60*  BUN 23   < > 12 11  CREATININE 1.52*   < > 0.94 0.77  CALCIUM 10.9*   < > 10.2 10.2  MG 1.7   < > 1.7  --   AST 16  --   --   --   ALT 19  --   --   --   ALKPHOS 79  --   --   --   BILITOT 1.5*  --   --   --    < > = values in this interval not displayed.   ------------------------------------------------------------------------------------------------------------------  Cardiac Enzymes No results for input(s): TROPONINI in the last 168 hours. ------------------------------------------------------------------------------------------------------------------  RADIOLOGY:  Dg Abd 1 View  Result Date: 09/19/2018 CLINICAL DATA:  Ileus EXAM: ABDOMEN - 1 VIEW COMPARISON:  Abdominal  CT from 2 days ago FINDINGS: Previously administered oral contrast is seen within ascending and transverse colon. No notable small bowel dilatation. No concerning mass effect or gas collection. IMPRESSION: Oral contrast administered 2 days has progressed into the colon. The colon is less distended than on prior CT. Electronically Signed   By: Monte Fantasia  M.D.   On: 09/19/2018 08:38   Ct Abdomen Pelvis W Contrast  Result Date: 09/17/2018 CLINICAL DATA:  Abdominal distention with nausea and vomiting EXAM: CT ABDOMEN AND PELVIS WITH CONTRAST TECHNIQUE: Multidetector CT imaging of the abdomen and pelvis was performed using the standard protocol following bolus administration of intravenous contrast. CONTRAST:  112mL OMNIPAQUE IOHEXOL 300 MG/ML  SOLN COMPARISON:  None. FINDINGS: Lower chest: Prominent bronchial lymph nodes size at the lung bases, not definitively pathologic based on coverage. Coronary atherosclerosis. No pericardial effusion. Hepatobiliary: No focal hepatic lesion. Gallbladder without evidence of inflammation. There is a Phrygian cap with small associated calcification. Pancreas: Unremarkable. Spleen: Unremarkable. Adrenals/Urinary Tract: Negative adrenals. Bilateral renal cortical scarring. There is a heterogeneous (mainly low-density) but with areas of calcification and possible enhancement) mass in the upper pole left kidney measuring 4.9 cm. This was not described on a 2006 abdominal CT at [facility]. Unremarkable bladder. Stomach/Bowel: The colon is diffusely dilated by gas and stool. There is rectal stool which is not convincingly obstructive. No appendicitis. No small bowel obstruction. Vascular/Lymphatic: No acute vascular abnormality. No mass or adenopathy. Reproductive:Minimal calcification in the uterus likely reflecting a fibroid. Other: No ascites or pneumoperitoneum. Musculoskeletal: No acute abnormalities. Lumbar facet degeneration with L4-5 anterolisthesis. Advanced L5-S1 disc degeneration. IMPRESSION: 1. Diffuse distention of the colon by gas and stool-possible colonic ileus. 2. 4.9 cm left renal mass that is complex and likely solid-new from a 2006 CT report at [facility]. Recommend renal MRI. 3. Distended gallbladder without evidence of acute inflammation. Electronically Signed   By: Monte Fantasia M.D.   On: 09/17/2018 14:18    EKG:  No  orders found for this or any previous visit.  ASSESSMENT AND PLAN:   73 year old female with developmental delay, type 2 diabetes mellitus, history of breast cancer status post right mastectomy, GERD, primary hyperparathyroidism, bilateral hearing loss presents to hospital secondary to generalized weakness and noted to have hyperglycemia.  1.  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 -Recently seen by endocrinology. -Was admitted to the hospital couple of months ago with severe hypoglycemia.  Patient was on Lantus at that time. -At recent visit 3 weeks ago, both Lantus and NovoLog has been discontinued and she was started on Trulicity weekly.  Glipizide was discontinued as well. -However patient was having side effects with Trulicity so according to the sister, she did not get her Trulicity it last week and has been restarted on Lantus at 18 units at bedtime and received NovoLog with meals as needed. -She is also on metformin. -Blood sugar on admission was 385, sugars have been fluctuating here as well.  A1c is 10.7 -Restarted on Lantus-slowly increasing at this time. -Diabetes coordinator input appreciated -We will need endocrinology follow-up after discharge  2.  Hypokalemia and hyponatremia-improved with fluids.  3.  Primary hyperparathyroidism-continue to monitor calcium  4.  Hypertension-on losartan, to help with proteinuria  5.  GERD-Protonix  6.  DVT prophylaxis-Lovenox  7.  Abdominal pain-CT of the abdomen showing colonic ileus.  Improved after bowel movement and patient passing gas. -KUB showing improvement today.  Advance to full liquid diet for lunch and solid diet later  today or tomorrow.  8.  Complex left renal mass-found incidentally on CT. appreciate urology input.  MRI of the kidneys recommended today prior to discharge.  We will follow-up with urology as outpatient  Physical therapy consulted-recommended rehab at discharge Updating sister  every day-still is interested in sending patient to rehab due to lack of enough help at home   All the records are reviewed and case discussed with Care Management/Social Workerr. Management plans discussed with the patient, family and they are in agreement.  CODE STATUS: Full Code  TOTAL TIME TAKING CARE OF THIS PATIENT: 38 minutes.   POSSIBLE D/C IN 1-2 DAYS, DEPENDING ON CLINICAL CONDITION.   Gladstone Lighter M.D on 09/19/2018 at 11:59 AM  Between 7am to 6pm - Pager - [phone]  After 6pm go to [email or web address] - password [email or web address] [facility]  Office  [phone]  CC: Primary care physician; Tam[place]

## 2018-09-19 NOTE — TOC Progression Note (Signed)
Transition of Care [facility]) - Progression Note    Patient Details  Name: Kim Nunez MRN: [id number] Date of Birth: June 07, 1945  Transition of Care [facility]) CM/SW [place], RN Phone Number: 09/19/2018, 8:05 AM  Clinical Narrative:  Damaris Schooner to Donata Clay the nurse at [facility], she stated that she talked with their physician and theta they do not want the patient to go to SNF, they will support her at home, they will provide the Physical therapy she will need.   Expected Discharge Plan: [place] Barriers to Discharge: Continued Medical Work up  Expected Discharge Plan and Services Expected Discharge Plan: [place]   Discharge Planning Services: CM Consult   Living arrangements for the past 2 months: Single Family Home Expected Discharge Date: 09/17/18                                     Social Determinants of Health (SDOH) Interventions    Readmission Risk Interventions No flowsheet data found.

## 2018-09-20 ENCOUNTER — Inpatient Hospital Stay: Payer: Medicare (Managed Care)

## 2018-09-20 ENCOUNTER — Encounter: Payer: Self-pay | Admitting: Radiology

## 2018-09-20 LAB — BASIC METABOLIC PANEL
Anion gap: 8 (ref 5–15)
BUN: 9 mg/dL (ref 8–23)
CO2: 24 mmol/L (ref 22–32)
Calcium: 10.5 mg/dL — ABNORMAL HIGH (ref 8.9–10.3)
Chloride: 104 mmol/L (ref 98–111)
Creatinine, Ser: 0.85 mg/dL (ref 0.44–1.00)
GFR calc Af Amer: >60 mL/min (ref >60)
GFR calc non Af Amer: >60 mL/min (ref >60)
Glucose, Bld: 78 mg/dL (ref 70–99)
Potassium: 4.3 mmol/L (ref 3.5–5.1)
Sodium: 136 mmol/L (ref 135–145)

## 2018-09-20 LAB — GLUCOSE, CAPILLARY
Glucose-Capillary: 119 mg/dL — ABNORMAL HIGH (ref 70–99)
Glucose-Capillary: 282 mg/dL — ABNORMAL HIGH (ref 70–99)
Glucose-Capillary: 318 mg/dL — ABNORMAL HIGH (ref 70–99)
Glucose-Capillary: 213 mg/dL — ABNORMAL HIGH (ref 70–99)

## 2018-09-20 MED ORDER — INSULIN ASPART 100 UNIT/ML ~~LOC~~ SOLN
2.0000 [IU] | Freq: Three times a day (TID) | SUBCUTANEOUS | Status: DC
  Administered 2018-09-20 – 2018-09-21 (×4): 2 [IU] via SUBCUTANEOUS
  Filled 2018-09-20 (×4): qty 1

## 2018-09-20 MED ORDER — GADOBUTROL 1 MMOL/ML IV SOLN
7.0000 mL | Freq: Once | INTRAVENOUS | Status: AC | PRN
  Administered 2018-09-20: 21:00:00 7 mL via INTRAVENOUS

## 2018-09-20 NOTE — Care Management Important Message (Signed)
Important Message  Patient Details  Name: Kim Nunez MRN: [id number] Date of Birth: 1945-07-11   Medicare Important Message Given:  Yes    Juliann Pulse A Johncarlo Maalouf 09/20/2018, 11:03 AM

## 2018-09-20 NOTE — NC FL2 (Signed)
[place] LEVEL OF CARE SCREENING TOOL     IDENTIFICATION  Patient Name: Kim Nunez Birthdate: Sep 20, 1945 Sex: female Admission Date (Current Location): 09/15/2018  [place] and [place] Number:  Engineering geologist and Address:  [facility], [address], [place], [place] [address]      Provider Number: [id number]  Attending Physician Name and Address:  Nicholes Mango, MD  Relative Name and Phone Number:       Current Level of Care: Hospital Recommended Level of Care: [place] Prior Approval Number:    Date Approved/Denied:   PASRR Number: [id number]  Discharge Plan: SNF    Current Diagnoses: Patient Active Problem List   Diagnosis Date Noted  . Hyperglycemia 09/15/2018    Orientation RESPIRATION BLADDER Height & Weight     Self  Normal Incontinent Weight: 167 lb 12.3 oz (76.1 kg) Height:  5\' 6"  (167.6 cm)  BEHAVIORAL SYMPTOMS/MOOD NEUROLOGICAL BOWEL NUTRITION STATUS  (none) (none) Continent Diet(Heart Healthy )  AMBULATORY STATUS COMMUNICATION OF NEEDS Skin   Extensive Assist Verbally Normal                       Personal Care Assistance Level of Assistance  Bathing, Feeding, Dressing Bathing Assistance: Limited assistance Feeding assistance: Independent Dressing Assistance: Limited assistance     Functional Limitations Info  Sight, Hearing, Speech Sight Info: Adequate Hearing Info: Adequate Speech Info: Adequate    SPECIAL CARE FACTORS FREQUENCY  PT (By licensed PT), OT (By licensed OT)     PT Frequency: 5 OT Frequency: 5            Contractures Contractures Info: Not present    Additional Factors Info  Code Status, Allergies Code Status Info: Full Code  Allergies Info: Atorvastin            Current Medications (09/20/2018):  This is the current hospital active medication list Current Facility-Administered Medications  Medication Dose Route Frequency Provider Last Rate  Last Dose  . acetaminophen (TYLENOL) tablet 650 mg  650 mg Oral Q6H PRN Gladstone Lighter, MD      . aspirin EC tablet 81 mg  81 mg Oral Daily Fritzi Mandes, MD   81 mg at 09/20/18 1104  . bisacodyl (DULCOLAX) suppository 10 mg  10 mg Rectal Daily PRN Gladstone Lighter, MD   10 mg at 09/18/18 1093  . cholecalciferol (VITAMIN D3) tablet 1,000 Units  1,000 Units Oral Daily Fritzi Mandes, MD   1,000 Units at 09/20/18 1104  . dorzolamide-timolol (COSOPT) 22.3-6.8 MG/ML ophthalmic solution 1 drop  1 drop Both Eyes BID Fritzi Mandes, MD   1 drop at 09/20/18 1104  . enoxaparin (LOVENOX) injection 40 mg  40 mg Subcutaneous Q24H Lang Snow, NP   40 mg at 09/19/18 2213  . insulin aspart (novoLOG) injection 0-5 Units  0-5 Units Subcutaneous QHS Gladstone Lighter, MD      . insulin aspart (novoLOG) injection 0-9 Units  0-9 Units Subcutaneous TID WC Gladstone Lighter, MD   5 Units at 09/20/18 1239  . insulin aspart (novoLOG) injection 2 Units  2 Units Subcutaneous TID WC Nicholes Mango, MD   2 Units at 09/20/18 1239  . insulin glargine (LANTUS) injection 15 Units  15 Units Subcutaneous Daily Gladstone Lighter, MD   15 Units at 09/20/18 1104  . latanoprost (XALATAN) 0.005 % ophthalmic solution 1 drop  1 drop Both Eyes QHS Fritzi Mandes, MD   1 drop at  09/19/18 2212  . losartan (COZAAR) tablet 25 mg  25 mg Oral Daily Fritzi Mandes, MD   Stopped at 09/20/18 1141  . magnesium oxide (MAG-OX) tablet 400 mg  400 mg Oral BID Fritzi Mandes, MD   400 mg at 09/20/18 1104  . metFORMIN (GLUCOPHAGE) tablet 1,000 mg  1,000 mg Oral BID WC Fritzi Mandes, MD   1,000 mg at 09/20/18 0810  . ondansetron (ZOFRAN) injection 4 mg  4 mg Intravenous Q6H PRN Harrie Foreman, MD   4 mg at 09/18/18 1117  . oxyCODONE (Oxy IR/ROXICODONE) immediate release tablet 5 mg  5 mg Oral Q6H PRN Lance Coon, MD   5 mg at 09/16/18 0040  . pantoprazole (PROTONIX) EC tablet 40 mg  40 mg Oral Daily Fritzi Mandes, MD   40 mg at 09/20/18 1104  .  polyethylene glycol (MIRALAX / GLYCOLAX) packet 17 g  17 g Oral Daily Fritzi Mandes, MD   17 g at 09/20/18 1103  . simethicone (MYLICON) chewable tablet 80 mg  80 mg Oral QID Gladstone Lighter, MD   80 mg at 09/20/18 1339     Discharge Medications: Please see discharge summary for a list of discharge medications.  Relevant Imaging Results:  Relevant Lab Results:   Additional Information SSN: [id number]  Annamaria Boots, [place]

## 2018-09-20 NOTE — Progress Notes (Signed)
[place] at [place] NAME: Kim Nunez    MR#:  [id number]  DATE OF BIRTH:  1945-12-16  SUBJECTIVE:  CHIEF COMPLAINT:  No chief complaint on file.  -Sugars are better.  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  HENT: Positive for hearing loss. Negative for nosebleeds.   Eyes: Negative for blurred vision and double vision.  Respiratory: Negative for cough, shortness of breath and wheezing.   Cardiovascular: Negative for chest pain, palpitations and leg swelling.  Gastrointestinal: Positive for abdominal pain. Negative for constipation, diarrhea, nausea and vomiting.  Genitourinary: Negative for dysuria.  Musculoskeletal: Negative for myalgias.  Neurological: Negative for dizziness, focal weakness, seizures, weakness and headaches.  Psychiatric/Behavioral: Negative for depression.    DRUG ALLERGIES:   Allergies  Allergen Reactions  . Atorvastatin Other (See Comments)    Muscle pain Muscle pain     VITALS:  Blood pressure (!) 110/59, pulse 85, temperature 97.8 F (36.6 C), temperature source Oral, resp. rate 17, height 5\' 6"  (1.676 m), weight 76.1 kg, SpO2 99 %.  PHYSICAL EXAMINATION:  Physical Exam   GENERAL:  73 y.o.-year-old patient lying in the bed with no acute distress.  Very hard of hearing EYES: Pupils equal, round, reactive to light and accommodation. No scleral icterus. Extraocular muscles intact.  HEENT: Head atraumatic, normocephalic. Oropharynx and nasopharynx clear.  NECK:  Supple, no jugular venous distention. No thyroid enlargement, no tenderness.  LUNGS: Normal breath sounds bilaterally, no wheezing, rales,rhonchi or crepitation. No use of accessory muscles of  respiration.  CARDIOVASCULAR: S1, S2 normal. No murmurs, rubs, or gallops.  ABDOMEN: Soft, improved distention, nontender.  No rigidity or rebound tenderness.  Good bowel sounds present EXTREMITIES: No pedal edema, cyanosis, or clubbing.  NEUROLOGIC: Awake alert and oriented-1-2 sensation intact. Gait not checked.  PSYCHIATRIC: The patient is alert and oriented x 1-2.  SKIN: No obvious rash, lesion, or ulcer.    LABORATORY PANEL:   CBC Recent Labs  Lab 09/16/18 0532  WBC 7.2  HGB 11.8*  HCT 35.9*  PLT 294   ------------------------------------------------------------------------------------------------------------------  Chemistries  Recent Labs  Lab 09/15/18 1747  09/18/18 0518  09/20/18 0425  NA 133*   < > 128*   < > 136  K 4.6   < > 4.3   < > 4.3  CL 94*   < > 96*   < > 104  CO2 16*   < > 23   < > 24  GLUCOSE 385*   < > 250*   < > 78  BUN 23   < > 12   < > 9  CREATININE 1.52*   < > 0.94   < > 0.85  CALCIUM 10.9*   < > 10.2   < > 10.5*  MG 1.7   < > 1.7  --   --   AST 16  --   --   --   --   ALT 19  --   --   --   --   ALKPHOS 79  --   --   --   --   BILITOT 1.5*  --   --   --   --    < > = values in this interval  not displayed.   ------------------------------------------------------------------------------------------------------------------  Cardiac Enzymes No results for input(s): TROPONINI in the last 168 hours. ------------------------------------------------------------------------------------------------------------------  RADIOLOGY:  Dg Abd 1 View  Result Date: 09/19/2018 CLINICAL DATA:  Ileus EXAM: ABDOMEN - 1 VIEW COMPARISON:  Abdominal CT from 2 days ago FINDINGS: Previously administered oral contrast is seen within ascending and transverse colon. No notable small bowel dilatation. No concerning mass effect or gas collection. IMPRESSION: Oral contrast administered 2 days has progressed into the colon. The colon is less distended than on prior CT.  Electronically Signed   By: Monte Fantasia M.D.   On: 09/19/2018 08:38    EKG:  No orders found for this or any previous visit.  ASSESSMENT AND PLAN:   73 year old female with developmental delay, type 2 diabetes mellitus, history of breast cancer status post right mastectomy, GERD, primary hyperparathyroidism, bilateral hearing loss presents to hospital secondary to generalized weakness and noted to have hyperglycemia.  1.  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 -Recently seen by endocrinology.  Sugars are better today -Patient needs outpatient endocrinology follow-up -Was admitted to the hospital couple of months ago with severe hypoglycemia.  Patient was on Lantus at that time. -At recent visit 3 weeks ago, both Lantus and NovoLog has been discontinued and she was started on Trulicity weekly.  Glipizide was discontinued as well. -However patient was having side effects with Trulicity so according to the sister, she did not get her Trulicity it last week and has been restarted on Lantus at 15 units at bedtime and received NovoLog with meals as needed. Started on NovoLog 2 units 3 times daily for meal coverage if she takes more than 50% of the meal -She is also on metformin. -Blood sugar on admission was 385, sugars have been fluctuating here as well.  A1c is 10.7 -Restarted on Lantus-slowly titrating Lantus dose at this time.  Plan is to discharge patient with Lantus and metformin -Diabetes coordinator input appreciated  2.  Falls -CT head negative MRI of the brain is pending hypokalemia and hyponatremia-improved with fluids. Physical therapy is recommending skilled nursing facility sister is agreeable  3.  Primary hyperparathyroidism-continue to monitor calcium  4.  Hypertension-on losartan, to help with proteinuria  5.  GERD-Protonix  6. Complex left renal mass-found incidentally on CT. appreciate urology input.  MRI of the kidneys recommended today  prior to discharge.  Scheduled for 8 PM today, patient will follow-up with urology dr.stoioff as outpatient  7.  Abdominal pain-CT of the abdomen showing colonic ileus.  Improved after bowel movement and patient passing gas. -KUB showing improvement today.  Tolerating advanced diet Physical therapy consulted-recommended rehab at discharge Updating sister every day-still is interested in sending patient to rehab due to lack of enough help at home   All the records are reviewed and case discussed with Care Management/Social Workerr. Management plans discussed with the patient,sister Kim Nunez-[phone] and they are in agreement.  CODE STATUS: Full Code  TOTAL TIME TAKING CARE OF THIS PATIENT: 38 minutes.   POSSIBLE D/C IN 1-2 DAYS, DEPENDING ON CLINICAL CONDITION.   Nicholes Mango M.D on 09/20/2018 at 11:47 AM  Between 7am to 6pm - Pager - [phone] After 6pm go to [email or web address] - password [email or web address] [place] [facility]  Office  [phone]  CC: Primary care physician; [place]

## 2018-09-20 NOTE — Progress Notes (Signed)
Physical Therapy Treatment Patient Details Name: Kim Nunez MRN: [id number] DOB: 21-Apr-1946 Today's Date: 09/20/2018    History of Present Illness Pt is a 73 y.o. female presenting to hospital 09/15/18: direct admit for uncontrolled hyperglycemia and electrolyte imbalance.  PMH includes B hearing loss, developmental delay, DM, htn, and breast CA s/p R breast mastectomy.    PT Comments    Pt requesting to use toilet at beginning of session.  Pt able to sit onto edge of bed with SBA but pt unable to stand on own (with multiple attempts) sitting edge of bed (use of RW) and still unable to stand with vc's/tactile cues/demo of therapist (and CGA); pt requring max assist to stand from bed with posterior lean noted.  After toileting, pt able to ambulate 100 feet with RW CGA to min assist (tends to drift to R requiring assist at times for walker positioning); increased shuffling gait noted with increased distance ambulating; distance ambulated limited d/t fatigue/generalized weakness.  Pt HOH requiring vc's, tactile cues, and demo for activities during session (and increased time).  Will continue to progress pt with strengthening and progressive functional mobility during hospital stay.  If decision is made for pt to discharge home, recommend 24/7 assist for functional mobility for safety.    Follow Up Recommendations  SNF     Equipment Recommendations  Rolling walker with 5" wheels;3in1 (PT)    Recommendations for Other Services OT consult     Precautions / Restrictions Precautions Precautions: Fall Precaution Comments: Per caregivers, pt easily becomes stiff. Would benefit from some streatching/PROM prior to mobility.  Restrictions Weight Bearing Restrictions: No    Mobility  Bed Mobility Overal bed mobility: Needs Assistance Bed Mobility: Supine to Sit     Supine to sit: Supervision;HOB elevated     General bed mobility comments: mild increased effort to perform on  own  Transfers Overall transfer level: Needs assistance Equipment used: Rolling walker (2 wheeled) Transfers: Sit to/from Stand Sit to Stand: Max assist;Min assist         General transfer comment: max assist to stand from bed (multiple attempts by pt on own and then with vc's/tactile cues of therapist pt still unable to stand on own requiring physical assist to stand); CGA to stand from [facility] over toilet (x2 trials with use of grab bar with R UE); min assist to stand from recliner; pt requiring cueing to scoot forward and bring LE's back towards sitting surface d/t posterior positioning noted with standing from all surfaces)  Ambulation/Gait Ambulation/Gait assistance: Min guard;Min assist Gait Distance (Feet): (20 feet to bathroom; 100 feet) Assistive device: Rolling walker (2 wheeled)   Gait velocity: decreased   General Gait Details: shuffling gait; decreased B LE step length/foot clearance/heelstrike (especially with increased distance ambulating and fatigue); intermittently stopping to look around; tends to drift to R requiring assisting for RW positioning at times (other times pt correcting on own)   Stairs             Wheelchair Mobility    Modified Rankin (Stroke Patients Only)       Balance Overall balance assessment: Needs assistance Sitting-balance support: No upper extremity supported;Feet supported Sitting balance-Leahy Scale: Good Sitting balance - Comments: steady sitting reaching within BOS     Standing balance-Leahy Scale: Good Standing balance comment: steady standing washing hands at sink (no UE support) reaching within BOS  Cognition Arousal/Alertness: Awake/alert Behavior During Therapy: Flat affect Overall Cognitive Status: No family/caregiver present to determine baseline cognitive functioning                                 General Comments: Distracted at times.      Exercises       General Comments   Nursing cleared pt for participation in physical therapy.  Pt agreeable to PT session.     Pertinent Vitals/Pain Pain Assessment: Faces Faces Pain Scale: No hurt Pain Intervention(s): Limited activity within patient's tolerance;Monitored during session;Repositioned  Vitals (HR and O2 on room air) stable and WFL throughout treatment session.    Home Living                      Prior Function            PT Goals (current goals can now be found in the care plan section) Acute Rehab PT Goals Patient Stated Goal: To get better PT Goal Formulation: With patient Time For Goal Achievement: 10/02/18 Potential to Achieve Goals: Fair Progress towards PT goals: Progressing toward goals    Frequency    Min 2X/week      PT Plan Current plan remains appropriate    Co-evaluation              AM-PAC PT "6 Clicks" Mobility   Outcome Measure  Help needed turning from your back to your side while in a flat bed without using bedrails?: None Help needed moving from lying on your back to sitting on the side of a flat bed without using bedrails?: A Little Help needed moving to and from a bed to a chair (including a wheelchair)?: A Little Help needed standing up from a chair using your arms (e.g., wheelchair or bedside chair)?: A Lot Help needed to walk in hospital room?: A Little Help needed climbing 3-5 steps with a railing? : A Lot 6 Click Score: 17    End of Session Equipment Utilized During Treatment: Gait belt Activity Tolerance: Patient tolerated treatment well Patient left: in chair;with call bell/phone within reach;with chair alarm set Nurse Communication: Mobility status;Precautions(NT notified purewick removed) PT Visit Diagnosis: Other abnormalities of gait and mobility (R26.89);Muscle weakness (generalized) (M62.81);Difficulty in walking, not elsewhere classified (R26.2)     Time: 9532-0233 PT Time Calculation (min) (ACUTE ONLY): 30  min  Charges:  $Gait Training: 8-22 mins $Therapeutic Activity: 8-22 mins                    Leitha Bleak, PT 09/20/18, 10:19 AM [phone]

## 2018-09-20 NOTE — Progress Notes (Addendum)
Inpatient Diabetes Program Recommendations  AACE/ADA: New Consensus Statement on Inpatient Glycemic Control (2015)  Target Ranges:  Prepandial:   less than 140 mg/dL      Peak postprandial:   less than 180 mg/dL (1-2 hours)      Critically ill patients:  140 - 180 mg/dL   Lab Results  Component Value Date   GLUCAP 119 (H) 09/20/2018   HGBA1C 10.7 (H) 09/16/2018    Review of Glycemic Control Results for Kim Nunez, APSEY (MRN [id number]) as of 09/20/2018 10:28  Ref. Range 09/19/2018 07:34 09/19/2018 11:44 09/19/2018 16:29 09/19/2018 21:21 09/20/2018 07:43  Glucose-Capillary Latest Ref Range: 70 - 99 mg/dL 70 253 (H) 289 (H) 70 119 (H)   Diabetes history: DM 2 Outpatient Diabetes medications:  Trulicity 8.18 weekly, Metformin 1000 mg bid  Current orders for Inpatient glycemic control:  Novolog moderate tid with meals and HS Lantus 15 units daily Metformin 1000 mg bid  Inpatient Diabetes Program Recommendations:     Postprandial glucose increases after meals. Patient consuming 100% of meals.   Could consider Novolog 2 units tid meal coverage if patient continues to consumes at least 50% of meals and glucose is at least 80 mg/dl.  F/u with endocrinology outpatient.    Spoke with Kim Nunez from [facility] to discuss patient assistance with medication needs. Kim Nunez discussed that they are basically the patient's insurance so they provide everything for the patient including medications, medication administration assistance if needed, and education. Kim Nunez mentioned the patient's sisters prefill syringes and the patient does great giving herself injections.  Kim Nunez mentioned that with the AVS and prescriptions they will be able to make adjustments to the medication regimen adequately.  Addendum 1221 pm:  Spoke with Kim Lever, NP with [facility] [phone]). She is working with Endocrinology for patient's glucose control outpatient (visits from December and February of this year). The past 2 weeks she  has placed patient back on Lantus qpm and Novolog 2 units at lunchtime to help with the hyperglycemia she was noticing at bedtime.  Ms. Kim Nunez said they were an [facility] on [facility] that Mrs. Kim Nunez goes to 3 days a week which have Dietitians, PT/OT/SW Engineer, maintenance (IT) and others on staff. Patient also gets home care twice a day during the week on days she is at home. [name] fixes meals and dials up her insulin on patient's insulin pen. Patient checks her own glucose and sister calls to make sure glucose is ok before insulin administration.  Gave Ms. Kim Nunez patient's glucose trends and current insulin regimen while in the hospital. Ms. Kim Nunez would like to simplify regimen at home for patient. Did discuss options. Do think patient needs basal insulin at home. Based on postprandial glucose trends discussed meal coverage tid versus taking Prandin tid for meals at home. NP to check with Endocrinology as well to see if patient is appropriate for this medication. The more simple the better for this patient and family at time of d/c.  Ms. Kim Infante, NP with [facility] would like update on glucose trends tomorrow if patient is still here. The goal is to keep patient at home and they have many resources for that to happen. In addition to keeping patient on simplified medication regimen if possible.  Thanks,  Tama Headings RN, MSN, BC-ADM Inpatient Diabetes Coordinator Team Pager [phone] (8a-5p)

## 2018-09-20 NOTE — TOC Progression Note (Signed)
Transition of Care [facility]) - Progression Note    Patient Details  Name: Kim Nunez MRN: [id number] Date of Birth: 1945-07-05  Transition of Care [facility]) CM/SW [place], [place] Phone Number: 09/20/2018, 3:00 PM  Clinical Narrative:   CSW spoke with Denisha with [facility] [phone] regarding discharge plan for patient. Donata Clay states that they have decided to allow patient to go to SNF at [facility]. CSW sent referral to [place]. CSW will continue to follow for discharge planning.     Expected Discharge Plan: [place] Barriers to Discharge: Continued Medical Work up  Expected Discharge Plan and Services Expected Discharge Plan: [place]   Discharge Planning Services: CM Consult   Living arrangements for the past 2 months: Single Family Home Expected Discharge Date: 09/17/18                                     Social Determinants of Health (SDOH) Interventions    Readmission Risk Interventions No flowsheet data found.

## 2018-09-20 NOTE — Progress Notes (Signed)
Occupational Therapy Treatment Patient Details Name: Kim Nunez MRN: [id number] DOB: November 28, 1945 Today's Date: 09/20/2018    History of present illness Pt is a 73 y.o. female presenting to hospital 09/15/18: direct admit for uncontrolled hyperglycemia and electrolyte imbalance.  PMH includes B hearing loss, developmental delay, DM, htn, and breast CA s/p R breast mastectomy.   OT comments  Pt seen for OT tx this date. Pt in recliner and requesting to use the bathroom. Pt required Min-Mod assist for STS and SPT to [facility] with Max cues for sequencing. Pt required +2 for peri care while OT facilitated BSC transfer after toileting task. Pt tolerated well. Pt continues to benefit from skilled OT services. Will continue to progress.   Follow Up Recommendations  SNF(HHOT should pt return home with sufficient supports in place)    Equipment Recommendations       Recommendations for Other Services      Precautions / Restrictions Precautions Precautions: Fall Precaution Comments: Per caregivers, pt easily becomes stiff. Would benefit from some streatching/PROM prior to mobility.  Restrictions Weight Bearing Restrictions: No       Mobility Bed Mobility               General bed mobility comments: deferred, up in recliner at start and end of session  Transfers Overall transfer level: Needs assistance Equipment used: Rolling walker (2 wheeled) Transfers: Sit to/from [facility] Sit to Stand: Mod assist;Min assist Stand pivot transfers: Mod assist;Min assist       General transfer comment: handheld assist, max cues    Balance Overall balance assessment: Needs assistance Sitting-balance support: No upper extremity supported;Feet supported Sitting balance-Leahy Scale: Good     Standing balance support: Bilateral upper extremity supported;During functional activity Standing balance-Leahy Scale: Fair                             ADL either performed  or assessed with clinical judgement   ADL Overall ADL's : Needs assistance/impaired                         Toilet Transfer: Moderate assistance;Minimal assistance;BSC;Stand-pivot Armed forces technical officer Details (indicate cue type and reason): handheld/RW assist, Max cues, min-mod assist Toileting- Clothing Manipulation and Hygiene: Maximal assistance;+2 for physical assistance;Sit to/from stand Toileting - Clothing Manipulation Details (indicate cue type and reason): nurse tech assisted with peri-care while OT assisted with transfer from [place] Vision/History: Wears glasses Wears Glasses: At all times Patient Visual Report: No change from baseline     Perception     Praxis      Cognition Arousal/Alertness: Awake/alert Behavior During Therapy: Flat affect Overall Cognitive Status: Difficult to assess                                          Exercises     Shoulder Instructions       General Comments      Pertinent Vitals/ Pain       Pain Assessment: No/denies pain  Home Living  Prior Functioning/Environment              Frequency  Min 2X/week        Progress Toward Goals  OT Goals(current goals can now be found in the care plan section)  Progress towards OT goals: Progressing toward goals  Acute Rehab OT Goals Patient Stated Goal: To get better Potential to Achieve Goals: Good  Plan Discharge plan remains appropriate;Frequency remains appropriate    Co-evaluation                 AM-PAC OT "6 Clicks" Daily Activity     Outcome Measure   Help from another person eating meals?: A Lot Help from another person taking care of personal grooming?: A Lot Help from another person toileting, which includes using toliet, bedpan, or urinal?: A Lot Help from another person bathing (including washing, rinsing, drying)?: A Lot Help from another  person to put on and taking off regular upper body clothing?: A Lot Help from another person to put on and taking off regular lower body clothing?: A Lot 6 Click Score: 12    End of Session Equipment Utilized During Treatment: Gait belt;Rolling walker  OT Visit Diagnosis: Muscle weakness (generalized) (M62.81);Cognitive communication deficit (R41.841)   Activity Tolerance Patient tolerated treatment well   Patient Left in chair;with call bell/phone within reach;with chair alarm set;with nursing/sitter in room   Nurse Communication          Time: 1340-1403 OT Time Calculation (min): 23 min  Charges: OT General Charges $OT Visit: 1 Visit OT Treatments $Self Care/Home Management : 23-37 mins  Jeni Salles, MPH, MS, OTR/L ascom [phone] 09/20/18, 3:53 PM

## 2018-09-21 ENCOUNTER — Inpatient Hospital Stay: Payer: Medicare (Managed Care)

## 2018-09-21 DIAGNOSIS — R269 Unspecified abnormalities of gait and mobility: Secondary | ICD-10-CM

## 2018-09-21 DIAGNOSIS — K8020 Calculus of gallbladder without cholecystitis without obstruction: Secondary | ICD-10-CM

## 2018-09-21 LAB — GLUCOSE, CAPILLARY
Glucose-Capillary: 184 mg/dL — ABNORMAL HIGH (ref 70–99)
Glucose-Capillary: 234 mg/dL — ABNORMAL HIGH (ref 70–99)
Glucose-Capillary: 122 mg/dL — ABNORMAL HIGH (ref 70–99)
Glucose-Capillary: 64 mg/dL — ABNORMAL LOW (ref 70–99)
Glucose-Capillary: 67 mg/dL — ABNORMAL LOW (ref 70–99)
Glucose-Capillary: 92 mg/dL (ref 70–99)

## 2018-09-21 LAB — VITAMIN B12: Vitamin B-12: 261 pg/mL (ref 180–914)

## 2018-09-21 MED ORDER — INSULIN ASPART 100 UNIT/ML ~~LOC~~ SOLN
4.0000 [IU] | Freq: Three times a day (TID) | SUBCUTANEOUS | Status: DC
  Administered 2018-09-21 – 2018-09-22 (×2): 4 [IU] via SUBCUTANEOUS
  Administered 2018-09-22: 08:00:00 2 [IU] via SUBCUTANEOUS
  Filled 2018-09-21 (×3): qty 1

## 2018-09-21 MED ORDER — INSULIN GLARGINE 100 UNIT/ML ~~LOC~~ SOLN
17.0000 [IU] | Freq: Every day | SUBCUTANEOUS | Status: DC
  Administered 2018-09-22: 08:00:00 17 [IU] via SUBCUTANEOUS
  Filled 2018-09-21 (×2): qty 0.17

## 2018-09-21 MED ORDER — FLEET ENEMA 7-19 GM/118ML RE ENEM: 1.0000 | ENEMA | Freq: Once | RECTAL | Status: DC | PRN

## 2018-09-21 NOTE — Consult Note (Signed)
Reason for Consult:Falls Referring Physician: Gouru  CC: Falls  HPI: Kim Nunez is an 73 y.o. female with a history of developmental delay who is unable to provide any history today so all history obtained from the chart.  Patient admitted due to difficulty controlling her blood sugars and medication side effects.  While these issues have been going on the patient has been noted to be weak and has had falls.  Consult called for further recommendations.    Past Medical History:  Diagnosis Date  . Breast cancer ([place])   . Developmental delay   . Diabetes mellitus type 2 with complications, uncontrolled ([place])   . Hyperlipidemia   . Hyperparathyroidism ([place])   . Hypertension     Past Surgical History:  Procedure Laterality Date  . right breast masectomy      No family history on file.  Social History:  reports that she has never smoked. She has never used smokeless tobacco. She reports that she does not drink alcohol or use drugs.  Allergies  Allergen Reactions  . Atorvastatin Other (See Comments)    Muscle pain Muscle pain     Medications:  I have reviewed the patient's current medications. Prior to Admission:  Medications Prior to Admission  Medication Sig Dispense Refill Last Dose  . acetaminophen (TYLENOL) 500 MG tablet Take 1,000 mg by mouth daily.   prn at prn  . aspirin EC 81 MG tablet Take 81 mg by mouth daily.   09/15/2018 at 0800  . Cholecalciferol (VITAMIN D3) 25 MCG (1000 UT) CAPS Take 1,000 Units by mouth daily.   09/15/2018 at 0800  . DESITIN 40 % PSTE Apply 1 application topically 2 (two) times a day.   09/15/2018 at 0800  . dorzolamide-timolol (COSOPT) 22.3-6.8 MG/ML ophthalmic solution Place 1 drop into both eyes 2 (two) times daily.   09/15/2018 at 0800  . Dulaglutide 0.75 MG/0.5ML SOPN Inject 0.75 mg into the skin once a week.   Past Week at Unknown time  . furosemide (LASIX) 20 MG tablet Take 20 mg by mouth daily.   09/15/2018 at 0800  . losartan (COZAAR) 25 MG  tablet Take 25 mg by mouth daily.   09/15/2018 at 0800  . magnesium oxide (MAG-OX) 400 (241.3 Mg) MG tablet Take 400 mg by mouth 2 (two) times daily.   Past Week at Unknown time  . metFORMIN (GLUCOPHAGE) 500 MG tablet Take 1,000 mg by mouth 2 (two) times a day.   09/15/2018 at 0800  . omeprazole (PRILOSEC) 20 MG capsule Take 20 mg by mouth daily.   09/15/2018 at 0800  . polyethylene glycol (MIRALAX / GLYCOLAX) 17 g packet Take 17 g by mouth daily.   09/15/2018 at 0800  . Travoprost, BAK Free, (TRAVATAN Z) 0.004 % SOLN ophthalmic solution Place 1 drop into both eyes every evening.   09/14/2018 at 2000   Scheduled: . aspirin EC  81 mg Oral Daily  . cholecalciferol  1,000 Units Oral Daily  . dorzolamide-timolol  1 drop Both Eyes BID  . enoxaparin (LOVENOX) injection  40 mg Subcutaneous Q24H  . insulin aspart  0-5 Units Subcutaneous QHS  . insulin aspart  0-9 Units Subcutaneous TID WC  . insulin aspart  2 Units Subcutaneous TID WC  . insulin glargine  15 Units Subcutaneous Daily  . latanoprost  1 drop Both Eyes QHS  . losartan  25 mg Oral Daily  . magnesium oxide  400 mg Oral BID  . metFORMIN  1,000  mg Oral BID WC  . pantoprazole  40 mg Oral Daily  . polyethylene glycol  17 g Oral Daily  . simethicone  80 mg Oral QID    ROS: Unable to provide  Physical Examination: Blood pressure 114/67, pulse 88, temperature 98.1 F (36.7 C), resp. rate 18, height 5\' 6"  (1.676 m), weight 76.1 kg, SpO2 97 %.  HEENT-  Normocephalic, no lesions, without obvious abnormality.  Normal external eye and conjunctiva.  Normal TM's bilaterally.  Normal auditory canals and external ears. Normal external nose, mucus membranes and septum.  Normal pharynx. Cardiovascular- S1, S2 normal, pulses palpable throughout   Lungs- chest clear, no wheezing, rales, normal symmetric air entry Abdomen- soft, non-tender; bowel sounds normal; no masses,  no organomegaly Extremities- no edema Lymph-no adenopathy  palpable Musculoskeletal-no joint tenderness, deformity or swelling Skin-warm and dry, no hyperpigmentation, vitiligo, or suspicious lesions  Neurological Examination   Mental Status: Alert.  Speech fluent but dysarthric.  Able to follow 3 step commands with reinforcement. Cranial Nerves: II: Discs flat bilaterally; Visual fields grossly normal, pupils equal, round, reactive to light and accommodation III,IV, VI: ptosis not present, extra-ocular motions intact bilaterally V,VII: smile symmetric, facial light touch sensation normal bilaterally VIII: decreased hearing IX,X: gag reflex present XI: bilateral shoulder shrug XII: midline tongue extension Motor: Right : Upper extremity   5/5    Left:     Upper extremity   5/5  Lower extremity   5/5     Lower extremity   5/5 Tone and bulk:normal tone throughout; no atrophy noted Sensory: Pinprick and light touch intact throughout, bilaterally Deep Tendon Reflexes: Symmetric throughout Plantars: Right: mute   Left: mute Cerebellar: Normal finger-to-nose and normal heel-to-shin testing bilaterally Gait: not tested due to safety concerns    Laboratory Studies:   Basic Metabolic Panel: Recent Labs  Lab 09/15/18 1747 09/16/18 0532 09/18/18 0518 09/19/18 0407 09/20/18 0425  NA 133* 138 128* 134* 136  K 4.6 3.4* 4.3 4.7 4.3  CL 94* 102 96* 102 104  CO2 16* 21* 23 22 24   GLUCOSE 385* 56* 250* 60* 78  BUN 23 16 12 11 9   CREATININE 1.52* 1.05* 0.94 0.77 0.85  CALCIUM 10.9* 10.5* 10.2 10.2 10.5*  MG 1.7 1.6* 1.7  --   --     Liver Function Tests: Recent Labs  Lab 09/15/18 1747  AST 16  ALT 19  ALKPHOS 79  BILITOT 1.5*  PROT 7.1  ALBUMIN 4.0   No results for input(s): LIPASE, AMYLASE in the last 168 hours. No results for input(s): AMMONIA in the last 168 hours.  CBC: Recent Labs  Lab 09/15/18 1747 09/16/18 0532  WBC 7.5 7.2  NEUTROABS 3.8  --   HGB 13.0 11.8*  HCT 40.9 35.9*  MCV 82.8 79.6*  PLT 295 294     Cardiac Enzymes: No results for input(s): CKTOTAL, CKMB, CKMBINDEX, TROPONINI in the last 168 hours.  BNP: Invalid input(s): POCBNP  CBG: Recent Labs  Lab 09/20/18 0743 09/20/18 1151 09/20/18 1647 09/20/18 2059 09/21/18 0759  GLUCAP 119* 282* 318* 213* 184*    Microbiology: Results for orders placed or performed during the hospital encounter of 09/15/18  Urine culture     Status: None   Collection Time: 09/15/18  6:16 PM  Result Value Ref Range Status   Specimen Description   Final    URINE, RANDOM Performed at [facility], [address] [place], [place] [address]    Special Requests  Final    NONE Performed at [facility], [address] [place], [place] [address]    Culture   Final    Multiple bacterial morphotypes present, none predominant. Suggest appropriate recollection if clinically indicated.   Report Status 09/17/2018 FINAL  Final  Novel Coronavirus, NAA (hospital order; send-out to ref lab)     Status: None   Collection Time: 09/18/18  9:40 AM  Result Value Ref Range Status   SARS-CoV-2, NAA NOT DETECTED NOT DETECTED Final    Comment: (NOTE) This test was developed and its performance characteristics determined by [employer]. This test has not been FDA cleared or approved. This test has been authorized by FDA under an Emergency Use Authorization (EUA). This test is only authorized for the duration of time the declaration that circumstances exist justifying the authorization of the emergency use of in vitro diagnostic tests for detection of SARS-CoV-2 virus and/or diagnosis of COVID-19 infection under section 564(b)(1) of the Act, 21 U.S.C. 563JSH-7(W)(2), unless the authorization is terminated or revoked sooner. When diagnostic testing is negative, the possibility of a false negative result should be considered in the context of a patient's recent exposures and the presence of clinical signs and symptoms consistent  with COVID-19. An individual without symptoms of COVID-19 and who is not shedding SARS-CoV-2 virus would expect to have a negative (not detected) result in this assay. Performed  At: [facility] [place], [place] [id number] Rush Farmer MD YD:[id number]    [place]  Final    Comment: Performed at [facility], [address] [place], [place] [address]    Coagulation Studies: No results for input(s): LABPROT, INR in the last 72 hours.  Urinalysis:  Recent Labs  Lab 09/15/18 1816  COLORURINE YELLOW*  LABSPEC 1.014  PHURINE 5.0  GLUCOSEU >=500*  HGBUR NEGATIVE  BILIRUBINUR NEGATIVE  KETONESUR 80*  PROTEINUR NEGATIVE  NITRITE NEGATIVE  LEUKOCYTESUR NEGATIVE    Lipid Panel:  No results found for: CHOL, TRIG, HDL, CHOLHDL, VLDL, LDLCALC  HgbA1C:  Lab Results  Component Value Date   HGBA1C 10.7 (H) 09/16/2018    Urine Drug Screen:  No results found for: LABOPIA, COCAINSCRNUR, LABBENZ, AMPHETMU, THCU, LABBARB  Alcohol Level: No results for input(s): ETH in the last 168 hours.   Imaging: Mr Brain Wo Contrast  Result Date: 09/20/2018 CLINICAL DATA:  Generalized weakness. EXAM: MRI HEAD WITHOUT CONTRAST TECHNIQUE: Multiplanar, multiecho pulse sequences of the brain and surrounding structures were obtained without intravenous contrast. 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 Diffusion sequences are nondiagnostic limiting assessment for acute infarct. FLAIR sequences are also severely degraded and of limited diagnostic utility. Generalized cerebral atrophy is mild for age. T2 hyperintensities are most notable in the periventricular white matter and are nonspecific but compatible with mild chronic small vessel ischemic disease. No midline shift or gross extra-axial fluid collection is identified. Vascular: Major arterial flow  voids at the base of the brain are grossly preserved. Skull and upper cervical spine: No suspicious marrow lesion identified. Sinuses/Orbits: Bilateral cataract extraction. Clear paranasal sinuses. Clear mastoid scar site clear left mastoid air cells. Other: None. IMPRESSION: 1. Limited examination due to extensive artifact from cochlear implant. Nondiagnostic assessment for acute infarct. 2. Mild chronic small vessel ischemic disease and cerebral atrophy. Electronically Signed   By: Logan Bores M.D.   On: 09/20/2018 21:08   Mr Abdomen W Wo Contrast  Result Date: 09/21/2018 CLINICAL DATA:  Inpatient. Indeterminate left renal mass on recent CT. History of right breast cancer status post mastectomy. EXAM: MRI ABDOMEN WITHOUT AND WITH CONTRAST TECHNIQUE: Multiplanar multisequence MR imaging of the abdomen was performed both before and after the administration of intravenous contrast. CONTRAST:  7 cc Gadavist IV. COMPARISON:  09/17/2018 CT abdomen/pelvis. FINDINGS: Limited motion degraded scan. Lower chest: No acute abnormality at the lung bases. Hepatobiliary: Normal liver size and configuration. No hepatic steatosis. Subcentimeter simple anterior segment 4A left liver lobe cyst. No suspicious liver masses. There is a 10 mm gallstone at the gallbladder neck with gallbladder distention (gallbladder diameter 4.8 cm). No gallbladder wall thickening. Trace pericholecystic fluid. No biliary ductal dilatation. Common bile duct diameter 5 mm. There is a small to moderate periampullary duodenal diverticulum. No evidence of choledocholithiasis. No biliary strictures or masses. Pancreas: No pancreatic mass or duct dilation.  No pancreas divisum. Spleen: Normal size. No mass. Adrenals/Urinary Tract: Normal adrenals. No hydronephrosis. 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 There is a simple 1.3 cm renal cyst in the lower right kidney. Stomach/Bowel: Normal non-distended stomach. Unremarkable visualized small bowel with no small bowel wall thickening. Diffuse large bowel distention by prominent gas and stool without appreciable large bowel wall thickening. Vascular/Lymphatic: Atherosclerotic nonaneurysmal abdominal aorta. Patent portal, splenic, hepatic and renal veins. No pathologically enlarged lymph nodes in the abdomen. Other: No abdominal ascites or focal fluid collection. Musculoskeletal: No aggressive appearing focal osseous lesions. IMPRESSION: 1. 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 No evidence of renal vein tumor thrombus. 2. No findings of metastatic disease in the abdomen. 3. Gallbladder neck 10 mm stone with gallbladder distention and trace pericholecystic fluid. No gallbladder wall thickening. Right upper quadrant abdominal ultrasound or hepatobiliary scintigraphy could be obtained for further evaluation if there is clinical concern for acute cholecystitis. 4. No biliary ductal dilatation. CBD diameter 5 mm. No evidence choledocholithiasis. 5. Diffuse large bowel distention by prominent gas and stool, suggestive of adynamic ileus. 6.  Aortic Atherosclerosis (ICD10-I70.0). Electronically Signed   By: Ilona Sorrel M.D.   On: 09/21/2018 07:55     Assessment/Plan: 73 year old female presenting with poorly controlled DM.  Has had recent frequent episodes of hypoglycemia and now with hyperglycemia.  Although gives adequate lower extremity strength while in bed, does not appear to do well with therapy, requiring assistance for bed mobility, transfers and gait.  Unclear baseline.  Although falls may be related to recent metabolic issues will rule  out other possibilities.  MRI of the brain performed and shows artifact due to her cochlear implant but no evidence of acute changes.  PN also present which may have worsened with recent metabolic issues and patient having difficulty compensating due to baseline cognitive issues.    Recommendations: 1. Thiamine, B12, RPR, heavy metal screen 2. MR of the lumbar spine   Alexis Goodell, MD Neurology [phone] 09/21/2018, 11:21 AM

## 2018-09-21 NOTE — Consult Note (Addendum)
Patient ID: Kim Nunez, female   DOB: 11-03-1945, 73 y.o.   MRN: [id number]  HPI Kim Nunez is a 73 y.o. female asked to see in consultation by Dr. Margaretmary Eddy for Gallstones, case d/w her in detail. She has a significant hx of dementia and hearing loss. She was admitted to control her BS and failure to thrive. Apparently a few days ago pt c/w abdominal pain. W/U started to include CT and MRI that I have personally reviewed, there is evidence of stone neck Gb , no definitive wall thickening. 5 cm Left renal mass .  She is pain free now and is eating lunch. No fevers or chills, History is taken from the chart, RN and hospitalist. CBC and bmp nml   HPI  Past Medical History:  Diagnosis Date  . Breast cancer ([place])   . Developmental delay   . Diabetes mellitus type 2 with complications, uncontrolled ([place])   . Hyperlipidemia   . Hyperparathyroidism ([place])   . Hypertension     Past Surgical History:  Procedure Laterality Date  . right breast masectomy      No relevant fam hx   Social History Social History   Tobacco Use  . Smoking status: Never Smoker  . Smokeless tobacco: Never Used  Substance Use Topics  . Alcohol use: Never    Frequency: Never  . Drug use: Never    Allergies  Allergen Reactions  . Atorvastatin Other (See Comments)    Muscle pain Muscle pain     Current Facility-Administered Medications  Medication Dose Route Frequency Provider Last Rate Last Dose  . acetaminophen (TYLENOL) tablet 650 mg  650 mg Oral Q6H PRN Gladstone Lighter, MD      . aspirin EC tablet 81 mg  81 mg Oral Daily Fritzi Mandes, MD   81 mg at 09/21/18 0859  . bisacodyl (DULCOLAX) suppository 10 mg  10 mg Rectal Daily PRN Gladstone Lighter, MD   10 mg at 09/21/18 0956  . cholecalciferol (VITAMIN D3) tablet 1,000 Units  1,000 Units Oral Daily Fritzi Mandes, MD   1,000 Units at 09/21/18 0859  . dorzolamide-timolol (COSOPT) 22.3-6.8 MG/ML ophthalmic solution 1 drop  1 drop Both Eyes BID  Fritzi Mandes, MD   1 drop at 09/21/18 0858  . enoxaparin (LOVENOX) injection 40 mg  40 mg Subcutaneous Q24H Lang Snow, NP   40 mg at 09/20/18 2155  . insulin aspart (novoLOG) injection 0-5 Units  0-5 Units Subcutaneous QHS Gladstone Lighter, MD      . insulin aspart (novoLOG) injection 0-9 Units  0-9 Units Subcutaneous TID WC Gladstone Lighter, MD   3 Units at 09/21/18 1315  . insulin aspart (novoLOG) injection 4 Units  4 Units Subcutaneous TID WC Gouru, Aruna, MD      . Derrill Memo ON 09/22/2018] insulin glargine (LANTUS) injection 17 Units  17 Units Subcutaneous Daily Gouru, Aruna, MD      . latanoprost (XALATAN) 0.005 % ophthalmic solution 1 drop  1 drop Both Eyes QHS Fritzi Mandes, MD   1 drop at 09/20/18 2155  . losartan (COZAAR) tablet 25 mg  25 mg Oral Daily Fritzi Mandes, MD   25 mg at 09/21/18 0859  . magnesium oxide (MAG-OX) tablet 400 mg  400 mg Oral BID Fritzi Mandes, MD   400 mg at 09/21/18 0859  . metFORMIN (GLUCOPHAGE) tablet 1,000 mg  1,000 mg Oral BID WC Fritzi Mandes, MD   1,000 mg at 09/21/18 0858  . ondansetron (ZOFRAN) injection  4 mg  4 mg Intravenous Q6H PRN Harrie Foreman, MD   4 mg at 09/18/18 0630  . oxyCODONE (Oxy IR/ROXICODONE) immediate release tablet 5 mg  5 mg Oral Q6H PRN Lance Coon, MD   5 mg at 09/16/18 0040  . pantoprazole (PROTONIX) EC tablet 40 mg  40 mg Oral Daily Fritzi Mandes, MD   40 mg at 09/21/18 0859  . polyethylene glycol (MIRALAX / GLYCOLAX) packet 17 g  17 g Oral Daily Fritzi Mandes, MD   17 g at 09/21/18 0856  . simethicone (MYLICON) chewable tablet 80 mg  80 mg Oral QID Gladstone Lighter, MD   80 mg at 09/21/18 1524  . sodium phosphate (FLEET) 7-19 GM/118ML enema 1 enema  1 enema Rectal Once PRN Gouru, Aruna, MD         Review of Systems Full ROS  was unable to be obtained due to pt dementia  Physical Exam Blood pressure 114/67, pulse 88, temperature 98.1 F (36.7 C), resp. rate 18, height 5\' 6"  (1.676 m), weight 76.1 kg, SpO2 97  %. CONSTITUTIONAL: pleasant debilitated and demented female  EYES: Pupils are equal, round, and reactive to light, Sclera are non-icteric. EARS, NOSE, MOUTH AND THROAT: The oropharynx is clear. The oral mucosa is pink and moist. Hearing is intact to voice. LYMPH NODES:  Lymph nodes in the neck are normal. RESPIRATORY:  Lungs are clear. There is normal respiratory effort, with equal breath sounds bilaterally, and without pathologic use of accessory muscles. CARDIOVASCULAR: Heart is regular without murmurs, gallops, or rubs. GI: The abdomen is  soft, nontender, and nondistended. There are no palpable masses. There is no hepatosplenomegaly. There are normal bowel sounds in all quadrants. GU: Rectal deferred.   MUSCULOSKELETAL: Normal muscle strength and tone. No cyanosis or edema.   SKIN: Turgor is good and there are no pathologic skin lesions or ulcers. NEUROLOGIC: Motor and sensation is grossly normal. PSYCH:  She is hard of hearing, SHe seems to know who she is but is  not oriented to time or place. Follows very simple commands  Data Reviewed  I have personally reviewed the patient's imaging, laboratory findings and medical records.    Assessment/Plan Incidental gallstone. At this time there is no clinical evidence of cholecystitis. I would not recommend any surgical intervention at this time. She does have renal mass that will need to be address in the outpt setting.  We may see her in the outpt setting.  We will be available for any surgical needs.  Caroleen Hamman, MD FACS General Surgeon 09/21/2018, 3:26 PM

## 2018-09-21 NOTE — Progress Notes (Signed)
[facility] [place] NAME: Kim Nunez    MR#:  [id number]  DATE OF BIRTH:  1946-02-23  SUBJECTIVE:  CHIEF COMPLAINT:  No chief complaint on file.  -Sugars are better.  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  HENT: Positive for hearing loss. Negative for nosebleeds.   Eyes: Negative for blurred vision and double vision.  Respiratory: Negative for cough, shortness of breath and wheezing.   Cardiovascular: Negative for chest pain, palpitations and leg swelling.  Gastrointestinal: Positive for abdominal pain. Negative for constipation, diarrhea, nausea and vomiting.  Genitourinary: Negative for dysuria.  Musculoskeletal: Negative for myalgias.  Neurological: Negative for dizziness, focal weakness, seizures, weakness and headaches.  Psychiatric/Behavioral: Negative for depression.    DRUG ALLERGIES:   Allergies  Allergen Reactions   Atorvastatin Other (See Comments)    Muscle pain Muscle pain     VITALS:  Blood pressure 114/67, pulse 88, temperature 98.1 F (36.7 C), resp. rate 18, height 5\' 6"  (1.676 m), weight 76.1 kg, SpO2 97 %.  PHYSICAL EXAMINATION:  Physical Exam   GENERAL:  73 y.o.-year-old patient lying in the bed with no acute distress.  Very hard of hearing EYES: Pupils equal, round, reactive to light and accommodation. No scleral icterus. Extraocular muscles intact.  HEENT: Head atraumatic, normocephalic. Oropharynx and nasopharynx clear.  NECK:  Supple, no jugular venous distention. No thyroid enlargement, no tenderness.  LUNGS: Normal breath sounds bilaterally, no wheezing, rales,rhonchi or crepitation. No use of accessory muscles of respiration.  CARDIOVASCULAR: S1, S2 normal. No murmurs, rubs, or gallops.    ABDOMEN: Soft, improved distention, nontender.  No rigidity or rebound tenderness.  Good bowel sounds present EXTREMITIES: No pedal edema, cyanosis, or clubbing.  NEUROLOGIC: Awake alert and oriented-1-2 sensation intact. Gait not checked.  PSYCHIATRIC: The patient is alert and oriented x 1-2.  SKIN: No obvious rash, lesion, or ulcer.    LABORATORY PANEL:   CBC Recent Labs  Lab 09/16/18 0532  WBC 7.2  HGB 11.8*  HCT 35.9*  PLT 294   ------------------------------------------------------------------------------------------------------------------  Chemistries  Recent Labs  Lab 09/15/18 1747  09/18/18 0518  09/20/18 0425  NA 133*   < > 128*   < > 136  K 4.6   < > 4.3   < > 4.3  CL 94*   < > 96*   < > 104  CO2 16*   < > 23   < > 24  GLUCOSE 385*   < > 250*   < > 78  BUN 23   < > 12   < > 9  CREATININE 1.52*   < > 0.94   < > 0.85  CALCIUM 10.9*   < > 10.2   < > 10.5*  MG 1.7   < > 1.7  --   --   AST 16  --   --   --   --   ALT 19  --   --   --   --   ALKPHOS 79  --   --   --   --   BILITOT 1.5*  --   --   --   --    < > = values in this interval not displayed.   ------------------------------------------------------------------------------------------------------------------  Cardiac Enzymes No results for input(s):  TROPONINI in the last 168 hours. ------------------------------------------------------------------------------------------------------------------  RADIOLOGY:  Mr Brain Wo Contrast  Result Date: 09/20/2018 CLINICAL DATA:  Generalized weakness. EXAM: MRI HEAD WITHOUT CONTRAST TECHNIQUE: Multiplanar, multiecho pulse sequences of the brain and surrounding structures were obtained without intravenous contrast. 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 Diffusion sequences are nondiagnostic limiting assessment for acute infarct. FLAIR sequences are also severely  degraded and of limited diagnostic utility. Generalized cerebral atrophy is mild for age. T2 hyperintensities are most notable in the periventricular white matter and are nonspecific but compatible with mild chronic small vessel ischemic disease. No midline shift or gross extra-axial fluid collection is identified. Vascular: Major arterial flow voids at the base of the brain are grossly preserved. Skull and upper cervical spine: No suspicious marrow lesion identified. Sinuses/Orbits: Bilateral cataract extraction. Clear paranasal sinuses. Clear mastoid scar site clear left mastoid air cells. Other: None. IMPRESSION: 1. Limited examination due to extensive artifact from cochlear implant. Nondiagnostic assessment for acute infarct. 2. Mild chronic small vessel ischemic disease and cerebral atrophy. Electronically Signed   By: Logan Bores M.D.   On: 09/20/2018 21:08   Mr Abdomen W Wo Contrast  Result Date: 09/21/2018 CLINICAL DATA:  Inpatient. Indeterminate left renal mass on recent CT. History of right breast cancer status post mastectomy. EXAM: MRI ABDOMEN WITHOUT AND WITH CONTRAST TECHNIQUE: Multiplanar multisequence MR imaging of the abdomen was performed both before and after the administration of intravenous contrast. CONTRAST:  7 cc Gadavist IV. COMPARISON:  09/17/2018 CT abdomen/pelvis. FINDINGS: Limited motion degraded scan. Lower chest: No acute abnormality at the lung bases. Hepatobiliary: Normal liver size and configuration. No hepatic steatosis. Subcentimeter simple anterior segment 4A left liver lobe cyst. No suspicious liver masses. There is a 10 mm gallstone at the gallbladder neck with gallbladder distention (gallbladder diameter 4.8 cm). No gallbladder wall thickening. Trace pericholecystic fluid. No biliary ductal dilatation. Common bile duct diameter 5 mm. There is a small to moderate periampullary duodenal diverticulum. No evidence of choledocholithiasis. No biliary strictures or masses.  Pancreas: No pancreatic mass or duct dilation.  No pancreas divisum. Spleen: Normal size. No mass. Adrenals/Urinary Tract: Normal adrenals. No hydronephrosis. 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 There is a simple 1.3 cm renal cyst in the lower right kidney. Stomach/Bowel: Normal non-distended stomach. Unremarkable visualized small bowel with no small bowel wall thickening. Diffuse large bowel distention by prominent gas and stool without appreciable large bowel wall thickening. Vascular/Lymphatic: Atherosclerotic nonaneurysmal abdominal aorta. Patent portal, splenic, hepatic and renal veins. No pathologically enlarged lymph nodes in the abdomen. Other: No abdominal ascites or focal fluid collection. Musculoskeletal: No aggressive appearing focal osseous lesions. IMPRESSION: 1. 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 No evidence of renal vein tumor thrombus. 2. No findings of metastatic disease in the abdomen. 3. Gallbladder neck 10 mm stone with gallbladder distention and trace pericholecystic fluid. No gallbladder wall thickening. Right upper quadrant abdominal ultrasound or hepatobiliary scintigraphy could be obtained for further evaluation if there is clinical concern for acute cholecystitis. 4. No biliary ductal dilatation. CBD diameter 5 mm. No evidence choledocholithiasis. 5. Diffuse large bowel  distention by prominent gas and stool, suggestive of adynamic ileus. 6.  Aortic Atherosclerosis (ICD10-I70.0). Electronically Signed   By: Ilona Sorrel M.D.   On: 09/21/2018 07:55    EKG:  No orders found for this or any previous  visit.  ASSESSMENT AND PLAN:   73 year old female with developmental delay, type 2 diabetes mellitus, history of breast cancer status post right mastectomy, GERD, primary hyperparathyroidism, bilateral hearing loss presents to hospital secondary to generalized weakness and noted to have hyperglycemia.  1.  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 -Recently seen by endocrinology.  Sugars are better  -Patient needs outpatient endocrinology follow-up -Was admitted to the hospital couple of months ago with severe hypoglycemia.  Patient was on Lantus at that time. -At recent visit 3 weeks ago, both Lantus and NovoLog has been discontinued and she was started on Trulicity weekly.  Glipizide was discontinued as well. -However patient was having side effects with Trulicity so according to the sister, she did not get her Trulicity it last week and has been restarted on Lantus at 17 units at bedtime and received NovoLog with meals as needed. Started on NovoLog 4 units 3 times daily for meal coverage if she takes more than 50% of the meal -She is also on metformin. -Blood sugar on admission was 385, sugars have been fluctuating here as well.  A1c is 10.7 -Restarted on Lantus-slowly titrating Lantus dose at this time.  Plan is to discharge patient with Lantus and metformin -Diabetes coordinator input appreciated  2.  Falls -CT head negative  MRI of the brain is nonconclusive because of the cochlear implant  Physical therapy is recommending skilled nursing facility sister is agreeable Seen by neurology who is recommending MRI of the lumbar spine, thiamine, B12, RPR and heavy metal screen  3.  Primary hyperparathyroidism-continue to monitor calcium  4.  Hypertension-on losartan, to help with proteinuria  5.  GERD-Protonix  6. Complex left renal mass-found incidentally on CT. appreciate urology input.  MRI of the abdomen has reviewed and surgery consulted as recommended by  urology Patient was seen by Dr. Dahlia Byes for gallbladder stones no surgical interventions needed as the patient is asymptomatic.  Outpatient follow-up   patient will follow-up with urology dr.stoioff as outpatient  7.  Abdominal pain-CT of the abdomen showing colonic ileus.  Improved after bowel movement and patient passing gas. -KUB showing improvement today.  Tolerating advanced diet Physical therapy consulted-recommended rehab at discharge Updating sister every day-still is interested in sending patient to rehab due to lack of enough help at home   All the records are reviewed and case discussed with Care Management/Social Workerr. Management plans discussed with the patient,sister Kim Nunez-[phone] and they are in agreement.  CODE STATUS: Full Code  TOTAL TIME TAKING CARE OF THIS PATIENT: 38 minutes.   POSSIBLE D/C IN 1-2 DAYS, DEPENDING ON CLINICAL CONDITION.   Nicholes Mango M.D on 09/21/2018 at 3:01 PM  Between 7am to 6pm - Pager - [phone] After 6pm go to [email or web address] - password [email or web address] [facility]  Office  [phone]  CC: Primary care physician; [place]

## 2018-09-21 NOTE — TOC Progression Note (Signed)
Transition of Care [facility]) - Progression Note    Patient Details  Name: Kim Nunez MRN: [id number] Date of Birth: 01-Apr-1946  Transition of Care [facility]) CM/SW [place], [place] Phone Number: 09/21/2018, 11:21 AM  Clinical Narrative: CSW spoke with Denisha from [place] again today regarding patient. Patient's family is requesting that she go to [facility] and not [facility]. Per Denisha, if [facility] has a bed at time of discharge then patient can be discharged to [facility] per family choice. CSW will continue to follow for discharge planning.       Expected Discharge Plan: [place] Barriers to Discharge: Continued Medical Work up  Expected Discharge Plan and Services Expected Discharge Plan: [place]   Discharge Planning Services: CM Consult   Living arrangements for the past 2 months: Single Family Home Expected Discharge Date: 09/21/18                                     Social Determinants of Health (SDOH) Interventions    Readmission Risk Interventions No flowsheet data found.

## 2018-09-21 NOTE — Progress Notes (Signed)
Inpatient Diabetes Program Recommendations  AACE/ADA: New Consensus Statement on Inpatient Glycemic Control (2015)  Target Ranges:  Prepandial:   less than 140 mg/dL      [facility] postprandial:   less than 180 mg/dL (1-2 hours)      Critically ill patients:  140 - 180 mg/dL   Lab Results  Component Value Date   GLUCAP 184 (H) 09/21/2018   HGBA1C 10.7 (H) 09/16/2018    Review of Glycemic Control Results for Kim Nunez, Kim Nunez (MRN [id number]) as of 09/21/2018 10:57  Ref. Range 09/20/2018 07:43 09/20/2018 11:51 09/20/2018 16:47 09/20/2018 20:59 09/21/2018 07:59  Glucose-Capillary Latest Ref Range: 70 - 99 mg/dL 119 (H) 282 (H) 318 (H) 213 (H) 184 (H)  Diabetes history:DM 2 Outpatient Diabetes medications: Trulicity 6.82 weekly, Metformin 1000 mg bid  Current orders for Inpatient glycemic control: Novolog moderate tid with meals and HS Lantus 15 units daily, Novolog 2 units tid w/ meals Metformin 1000 mg bid  Inpatient Diabetes Program Recommendations:     Note plans for d/c to SNF.  It does appear that patient needs some sort of meal coverage as blood sugars are increasing throughout the day.  However unsure that this is something patient can do throughout day with each meal? Note that Diabetes Coordinator spoke with [facility] RN yesterday re. Trends and potential for Prandin with meals?   No further recommendations today.   Thanks  Adah Perl, RN, BC-ADM Inpatient Diabetes Coordinator Pager [phone] (8a-5p)

## 2018-09-21 NOTE — Progress Notes (Addendum)
CBG of 64 Juice provided. CBG increased to 67. Provided sandwich tray and more juice per pt request, following intake of CBG of 92 resulted. Will continue to monitor

## 2018-09-22 LAB — GLUCOSE, CAPILLARY
Glucose-Capillary: 428 mg/dL — ABNORMAL HIGH (ref 70–99)
Glucose-Capillary: 299 mg/dL — ABNORMAL HIGH (ref 70–99)
Glucose-Capillary: 262 mg/dL — ABNORMAL HIGH (ref 70–99)

## 2018-09-22 LAB — RPR: RPR Ser Ql: NONREACTIVE

## 2018-09-22 MED ORDER — INSULIN GLARGINE 100 UNIT/ML ~~LOC~~ SOLN: 17.0000 [IU] | Freq: Every day | SUBCUTANEOUS | 11 refills | Status: AC

## 2018-09-22 MED ORDER — INSULIN ASPART 100 UNIT/ML ~~LOC~~ SOLN: 14.0000 [IU] | Freq: Once | SUBCUTANEOUS | Status: DC

## 2018-09-22 MED ORDER — INSULIN ASPART 100 UNIT/ML ~~LOC~~ SOLN: 4.0000 [IU] | Freq: Three times a day (TID) | SUBCUTANEOUS | 11 refills | Status: DC

## 2018-09-22 MED ORDER — SIMETHICONE 80 MG PO CHEW: 80.0000 mg | CHEWABLE_TABLET | Freq: Four times a day (QID) | ORAL | 0 refills | Status: AC

## 2018-09-22 MED ORDER — OXYCODONE HCL 5 MG PO TABS: 5.0000 mg | ORAL_TABLET | Freq: Four times a day (QID) | ORAL | 0 refills | Status: DC | PRN

## 2018-09-22 MED ORDER — INSULIN ASPART 100 UNIT/ML ~~LOC~~ SOLN
12.0000 [IU] | Freq: Once | SUBCUTANEOUS | Status: AC
  Administered 2018-09-22: 08:00:00 12 [IU] via SUBCUTANEOUS
  Filled 2018-09-22: qty 1

## 2018-09-22 NOTE — Progress Notes (Signed)
Subjective: Patient remains stable.  Gives very inconsistent information.  At times reports she is having leg pain.  When attempt to examine she reports no pain even with palpation and movement.  Later reports that the pain is elsewhere.    Objective: Current vital signs: BP (!) 105/55 (BP Location: Left Arm)   Pulse 82   Temp 97.9 F (36.6 C)   Resp 16   Ht 5\' 6"  (1.676 m)   Wt 76.1 kg   SpO2 97%   BMI 27.08 kg/m  Vital signs in last 24 hours: Temp:  [97.9 F (36.6 C)-98 F (36.7 C)] 97.9 F (36.6 C) (05/08 0417) Pulse Rate:  [81-82] 82 (05/08 0417) Resp:  [16] 16 (05/08 0417) BP: (105-125)/(55-57) 105/55 (05/08 0417) SpO2:  [97 %-99 %] 97 % (05/08 0417)  Intake/Output from previous day: 05/07 0701 - 05/08 0700 In: 840 [P.O.:840] Out: 400 [Urine:400] Intake/Output this shift: Total I/O In: 360 [P.O.:360] Out: -  Nutritional status:  Diet Order            Diet heart healthy/carb modified Room service appropriate? Yes; Fluid consistency: Thin  Diet effective now              Neurologic Exam: Mental Status: Alert.  Speech fluent but dysarthric.  Able to follow 3 step commands with reinforcement. Cranial Nerves: II: Blinks to bilateral confrontation, pupils equal, round, reactive to light and accommodation III,IV, VI: ptosis not present, extra-ocular motions intact bilaterally V,VII: smile symmetric, facial light touch sensation normal bilaterally VIII: decreased hearing IX,X: gag reflex present XI: bilateral shoulder shrug XII: midline tongue extension Motor: Moves all extremities strongly against gravity with no focal weakness noted Sensory: Pinprick and light touch intact throughout, bilaterally   Lab Results: Basic Metabolic Panel: Recent Labs  Lab 09/15/18 1747 09/16/18 0532 09/18/18 0518 09/19/18 0407 09/20/18 0425  NA 133* 138 128* 134* 136  K 4.6 3.4* 4.3 4.7 4.3  CL 94* 102 96* 102 104  CO2 16* 21* 23 22 24   GLUCOSE 385* 56* 250* 60* 78   BUN 23 16 12 11 9   CREATININE 1.52* 1.05* 0.94 0.77 0.85  CALCIUM 10.9* 10.5* 10.2 10.2 10.5*  MG 1.7 1.6* 1.7  --   --     Liver Function Tests: Recent Labs  Lab 09/15/18 1747  AST 16  ALT 19  ALKPHOS 79  BILITOT 1.5*  PROT 7.1  ALBUMIN 4.0   No results for input(s): LIPASE, AMYLASE in the last 168 hours. No results for input(s): AMMONIA in the last 168 hours.  CBC: Recent Labs  Lab 09/15/18 1747 09/16/18 0532  WBC 7.5 7.2  NEUTROABS 3.8  --   HGB 13.0 11.8*  HCT 40.9 35.9*  MCV 82.8 79.6*  PLT 295 294    Cardiac Enzymes: No results for input(s): CKTOTAL, CKMB, CKMBINDEX, TROPONINI in the last 168 hours.  Lipid Panel: No results for input(s): CHOL, TRIG, HDL, CHOLHDL, VLDL, LDLCALC in the last 168 hours.  CBG: Recent Labs  Lab 09/21/18 1655 09/21/18 2143 09/21/18 2229 09/21/18 2252 09/22/18 0748  GLUCAP 122* 64* 67* 92 428*    Microbiology: Results for orders placed or performed during the hospital encounter of 09/15/18  Urine culture     Status: None   Collection Time: 09/15/18  6:16 PM  Result Value Ref Range Status   Specimen Description   Final    URINE, RANDOM Performed at [facility], [address] [place], [place] [address]  Special Requests   Final    NONE Performed at [facility], [address] [place], [place] [address]    Culture   Final    Multiple bacterial morphotypes present, none predominant. Suggest appropriate recollection if clinically indicated.   Report Status 09/17/2018 FINAL  Final  Novel Coronavirus, NAA (hospital order; send-out to ref lab)     Status: None   Collection Time: 09/18/18  9:40 AM  Result Value Ref Range Status   SARS-CoV-2, NAA NOT DETECTED NOT DETECTED Final    Comment: (NOTE) This test was developed and its performance characteristics determined by [employer]. This test has not been FDA cleared or approved. This test has been authorized by FDA under an Emergency  Use Authorization (EUA). This test is only authorized for the duration of time the declaration that circumstances exist justifying the authorization of the emergency use of in vitro diagnostic tests for detection of SARS-CoV-2 virus and/or diagnosis of COVID-19 infection under section 564(b)(1) of the Act, 21 U.S.C. 341DQQ-2(W)(9), unless the authorization is terminated or revoked sooner. When diagnostic testing is negative, the possibility of a false negative result should be considered in the context of a patient's recent exposures and the presence of clinical signs and symptoms consistent with COVID-19. An individual without symptoms of COVID-19 and who is not shedding SARS-CoV-2 virus would expect to have a negative (not detected) result in this assay. Performed  At: [facility] [place], [place] [id number] Rush Farmer MD RD:[id number]    [place]  Final    Comment: Performed at [facility], [address] [place],  [address]    Coagulation Studies: No results for input(s): LABPROT, INR in the last 72 hours.  Imaging: Mr Brain Wo Contrast  Result Date: 09/20/2018 CLINICAL DATA:  Generalized weakness. EXAM: MRI HEAD WITHOUT CONTRAST TECHNIQUE: Multiplanar, multiecho pulse sequences of the brain and surrounding structures were obtained without intravenous contrast. 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 Diffusion sequences are nondiagnostic limiting assessment for acute infarct. FLAIR sequences are also severely degraded and of limited diagnostic utility. Generalized cerebral atrophy is mild for age. T2 hyperintensities are most notable in the periventricular white matter and are nonspecific but compatible with mild chronic small vessel ischemic disease. No midline shift or gross extra-axial fluid collection is  identified. Vascular: Major arterial flow voids at the base of the brain are grossly preserved. Skull and upper cervical spine: No suspicious marrow lesion identified. Sinuses/Orbits: Bilateral cataract extraction. Clear paranasal sinuses. Clear mastoid scar site clear left mastoid air cells. Other: None. IMPRESSION: 1. Limited examination due to extensive artifact from cochlear implant. Nondiagnostic assessment for acute infarct. 2. Mild chronic small vessel ischemic disease and cerebral atrophy. Electronically Signed   By: Logan Bores M.D.   On: 09/20/2018 21:08   Mr Lumbar Spine Wo Contrast  Result Date: 09/21/2018 CLINICAL DATA:  Muscle weakness.  Multiple falls. EXAM: MRI LUMBAR SPINE WITHOUT CONTRAST TECHNIQUE: Multiplanar, multisequence MR imaging of the lumbar spine was performed. No intravenous contrast was administered. COMPARISON:  None. FINDINGS: Segmentation:  Standard. Alignment:  Minimal grade 1 anterolisthesis of L4 on L5. Vertebrae:  No fracture, evidence of discitis, or bone lesion. Conus medullaris and cauda equina: Conus extends to the T12 level. Conus and cauda equina appear normal. Paraspinal and other soft tissues: No acute paraspinal abnormality. 4.5 cm complex left renal mass most concerning for renal cell  carcinoma. Disc levels: Disc spaces: Degenerative disease with disc height loss at L5-S1. T12-L1: No significant disc bulge. No evidence of neural foraminal stenosis. No central canal stenosis. L1-L2: Minimal broad-based disc bulge. Mild bilateral facet arthropathy. No evidence of neural foraminal stenosis. No central canal stenosis. L2-L3: Minimal broad-based disc bulge. Mild bilateral facet arthropathy. No evidence of neural foraminal stenosis. No central canal stenosis. L3-L4: Minimal broad-based disc bulge. Moderate left facet arthropathy. No evidence of neural foraminal stenosis. No central canal stenosis. L4-L5: Mild broad-based disc bulge. Severe bilateral facet arthropathy with  ligamentum flavum infolding. Moderate-severe spinal stenosis and bilateral lateral recess stenosis. No evidence of neural foraminal stenosis. L5-S1: Broad-based disc bulge. Moderate bilateral facet arthropathy. Mild spinal stenosis. Moderate right foraminal stenosis. Minimal left foraminal stenosis. No central canal stenosis. IMPRESSION: 1. Diffuse lumbar spine spondylosis as described above. 2. At L4-5 there is a mild broad-based disc bulge. Severe bilateral facet arthropathy with ligamentum flavum infolding. Moderate-severe spinal stenosis and bilateral lateral recess stenosis. 3. 4.5 cm left renal mass most concerning for renal cell carcinoma. Electronically Signed   By: Kathreen Devoid   On: 09/21/2018 21:25   Mr Abdomen W Wo Contrast  Result Date: 09/21/2018 CLINICAL DATA:  Inpatient. Indeterminate left renal mass on recent CT. History of right breast cancer status post mastectomy. EXAM: MRI ABDOMEN WITHOUT AND WITH CONTRAST TECHNIQUE: Multiplanar multisequence MR imaging of the abdomen was performed both before and after the administration of intravenous contrast. CONTRAST:  7 cc Gadavist IV. COMPARISON:  09/17/2018 CT abdomen/pelvis. FINDINGS: Limited motion degraded scan. Lower chest: No acute abnormality at the lung bases. Hepatobiliary: Normal liver size and configuration. No hepatic steatosis. Subcentimeter simple anterior segment 4A left liver lobe cyst. No suspicious liver masses. There is a 10 mm gallstone at the gallbladder neck with gallbladder distention (gallbladder diameter 4.8 cm). No gallbladder wall thickening. Trace pericholecystic fluid. No biliary ductal dilatation. Common bile duct diameter 5 mm. There is a small to moderate periampullary duodenal diverticulum. No evidence of choledocholithiasis. No biliary strictures or masses. Pancreas: No pancreatic mass or duct dilation.  No pancreas divisum. Spleen: Normal size. No mass. Adrenals/Urinary Tract: Normal adrenals. No hydronephrosis. 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 There is a simple 1.3 cm renal cyst in the lower right kidney. Stomach/Bowel: Normal non-distended stomach. Unremarkable visualized small bowel with no small bowel wall thickening. Diffuse large bowel distention by prominent gas and stool without appreciable large bowel wall thickening. Vascular/Lymphatic: Atherosclerotic nonaneurysmal abdominal aorta. Patent portal, splenic, hepatic and renal veins. No pathologically enlarged lymph nodes in the abdomen. Other: No abdominal ascites or focal fluid collection. Musculoskeletal: No aggressive appearing focal osseous lesions. IMPRESSION: 1. 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 No evidence of renal vein tumor thrombus. 2. No findings of metastatic disease in the abdomen. 3. Gallbladder neck 10 mm stone with gallbladder distention and trace pericholecystic fluid. No gallbladder wall thickening. Right upper quadrant abdominal ultrasound or hepatobiliary scintigraphy could be obtained for further evaluation if there is clinical concern for acute cholecystitis. 4. No biliary ductal dilatation. CBD diameter 5 mm. No evidence choledocholithiasis. 5. Diffuse large bowel distention by prominent gas and stool, suggestive of  adynamic ileus. 6.  Aortic Atherosclerosis (ICD10-I70.0). Electronically Signed   By: Ilona Sorrel M.D.   On: 09/21/2018 07:55    Medications:  I have reviewed the patient's current medications. Scheduled: . aspirin EC  81 mg Oral Daily  . cholecalciferol  1,000 Units Oral Daily  . dorzolamide-timolol  1 drop Both Eyes BID  . enoxaparin  (LOVENOX) injection  40 mg Subcutaneous Q24H  . insulin aspart  0-5 Units Subcutaneous QHS  . insulin aspart  0-9 Units Subcutaneous TID WC  . insulin aspart  4 Units Subcutaneous TID WC  . insulin glargine  17 Units Subcutaneous Daily  . latanoprost  1 drop Both Eyes QHS  . losartan  25 mg Oral Daily  . magnesium oxide  400 mg Oral BID  . metFORMIN  1,000 mg Oral BID WC  . pantoprazole  40 mg Oral Daily  . polyethylene glycol  17 g Oral Daily  . simethicone  80 mg Oral QID    Assessment/Plan: 73 year old admitted with falls.  Etiology unclear.  Metabolic issues remain in the differential.  MRI of the lumbar spine reviewed and shows L4-5 moderate to severe spinal stenosis.  Weakness therefore may very well be multifactorial in nature.    Recommendations: 1. Continued treatment of metabolic issues 2. Evaluation of spinal stenosis 3. Patient will also likely benefit from NCV/EMG of the lower extremities which will need to be performed on an outpatient basis.     LOS: 7 days   Alexis Goodell, MD Neurology [phone] 09/22/2018  10:10 AM

## 2018-09-22 NOTE — Progress Notes (Signed)
Dr Margaretmary Eddy aware of BS 428, MD to place orders

## 2018-09-22 NOTE — Care Management Important Message (Signed)
Important Message  Patient Details  Name: Kim Nunez MRN: [id number] Date of Birth: 07/31/1945   Medicare Important Message Given:  Yes    Dannette Barbara 09/22/2018, 3:03 PM

## 2018-09-22 NOTE — Progress Notes (Signed)
Inpatient Diabetes Program Recommendations  AACE/ADA: New Consensus Statement on Inpatient Glycemic Control (2015)  Target Ranges:  Prepandial:   less than 140 mg/dL      Peak postprandial:   less than 180 mg/dL (1-2 hours)      Critically ill patients:  140 - 180 mg/dL   Lab Results  Component Value Date   GLUCAP 428 (H) 09/22/2018   HGBA1C 10.7 (H) 09/16/2018    Review of Glycemic Control Results for REDELL, BHANDARI (MRN [id number]) as of 09/22/2018 09:17  Ref. Range 09/21/2018 07:59 09/21/2018 11:50 09/21/2018 16:55 09/21/2018 21:43 09/21/2018 22:29 09/21/2018 22:52 09/22/2018 07:48  Glucose-Capillary Latest Ref Range: 70 - 99 mg/dL 184 (H) 234 (H) 122 (H) 64 (L) 67 (L) 92 428 (H)  Diabetes history:DM 2 Outpatient Diabetes medications: Trulicity 1.83 weekly, Metformin 1000 mg bid  Current orders for Inpatient glycemic control: Novolog moderate tid with meals and HS Lantus 17units daily, Novolog 2 units tid w/ meals Metformin 1000 mg bid Inpatient Diabetes Program Recommendations:     Note that blood sugar >400 this morning.  Of note, patient did have low blood sugar last night.  Per RN note, she had juice and sandwich tray which could have contributed to higher blood sugar this morning.  Patient is sensitive to insulin doses and also intake.    Consider reducing Novolog correction to sensitive.  Agree with increase in Lantus.  May need to allow for more conservative glucose management to prevent low blood sugars.   Thanks,  Adah Perl, RN, BC-ADM Inpatient Diabetes Coordinator Pager [phone] (8a-5p)

## 2018-09-22 NOTE — TOC Transition Note (Signed)
Transition of Care [facility]) - CM/SW Discharge Note   Patient Details  Name: Kim Nunez MRN: [id number] Date of Birth: Nov 08, 1945  [facility] CM/SW Contact:  Annamaria Boots, [place] Phone Number: 09/22/2018, 12:19 PM   Clinical Narrative:   Patient is medically ready for discharge today to [facility]. CSW notified [facility], patient's sister and [facility] of discharge today. Patient will be transported by [facility].     Final next level of care: Skilled Nursing Facility Barriers to Discharge: No Barriers Identified   Patient Goals and CMS Choice Patient states their goals for this hospitalization and ongoing recovery are:: unable to assess      Discharge Placement   Existing PASRR number confirmed : 09/20/18          Patient chooses bed at: [facility] Patient to be transferred to facility by: [facility] Name of family member notified: Notified Denisha with [facility]  Patient and family notified of of transfer: 09/22/18  Discharge Plan and Services   Discharge Planning Services: CM Consult                                 Social Determinants of Health (SDOH) Interventions     Readmission Risk Interventions No flowsheet data found.

## 2018-09-22 NOTE — Discharge Instructions (Signed)
Follow-up with primary care physician at the facility in 2 to 3 days Follow-up with neurology Dr. Sherre Scarlet frequent falls/ambulatory dysfunction, outpatient nerve conduction velocities and EMG Neurosurgery-in 1 to 2 weeks [facility] program will arrange it Outpatient endocrinology follow-up at earliest possible in 4 to 5 days-[facility] program agreed to coordinate the care

## 2018-09-22 NOTE — Discharge Summary (Signed)
[facility] [place] NAME: Kim Nunez    MR#:  [id number]  DATE OF BIRTH:  Jun 08, 1945  DATE OF ADMISSION:  09/15/2018 ADMITTING PHYSICIAN: Fritzi Mandes, MD  DATE OF DISCHARGE:  09/22/18  PRIMARY CARE PHYSICIAN: [place]    ADMISSION DIAGNOSIS:  uncontrolled diabetes  DISCHARGE DIAGNOSIS:  Active Problems:   Hyperglycemia   SECONDARY DIAGNOSIS:   Past Medical History:  Diagnosis Date  . Breast cancer ([place])   . Developmental delay   . Diabetes mellitus type 2 with complications, uncontrolled ([place])   . Hyperlipidemia   . Hyperparathyroidism ([place])   . Hypertension     HOSPITAL COURSE:  Kim Nunez  is a 73 y.o. female with a known history of type 2 diabetes mellitus with hyperglycemia, hypertension, hyperparathyroidism, hyperlipidemia, developmental delay, breast cancer status post mastectomy of right breast and bilateral hearing loss presenting as a direct admit from [place] with chief complaints of hyperglycemia and electrolyte imbalance per her PCP.  She is not a good historian therefore history mostly obtained from patient's PCP notes in patient's chart.  Per patient's sister who is her POA, patient has history of hypoglycemic episode requiring admission and insulin adjustment.  She was last seen by endocrinologist on 08/2018 for evaluation of uncontrolled hyper/hypoglycemia.  Her blood sugar log at that time revealed fasting blood sugars of 1 46-285, 213 06/21/2007 before lunch and 2 35-4 48 before dinner.  She continued to have hypoglycemic events with blood sugars in the 50s.  Due to repeated hypoglycemic events despite reduction in insulin, her endocrinologist decided to discontinue insulin and give trial of Trulicity.  Patient sister, since starting Trulicity patient has had episode of generalized weakness, nausea, vomiting and stiffness.  Patient is been also having difficulty performing  her activities of daily living.  She is usually independent and participate in a pace (program of all inclusive care for the elderly) program through [employer] care.  Patient sister reports that today in the morning patient was confused upon waking up and unable to get out of the bed requiring 2 person assist.  Patient sister called her PCP advised that patient be admitted for further evaluation.  Per patient's PCP prior lab work had revealed hyperglycemia and hypo-magnesium.  She was therefore admitted for further work-up and management of her uncontrolled blood glucose and possible electrolyte imbalance.   1.  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 -Recently seen by endocrinology.  Sugars are better  -Patient needs outpatient endocrinology follow-up -Was admitted to the hospital couple of months ago with severe hypoglycemia.  Patient was on Lantus at that time. -At recent visit 3 weeks ago, both Lantus and NovoLog has been discontinued and she was started on Trulicity weekly.  Glipizide was discontinued as well. -However patient was having side effects with Trulicity so according to the sister, she did not get her Trulicity it last week and has been restarted on Lantus at 17 units at bedtime and received NovoLog with meals as needed. Started on NovoLog 4 units 3 times daily for meal coverage if she takes more than 50% of the meal -She is also on metformin. -Blood sugar on admission was 385, sugars have been fluctuating here as well.  A1c is 10.7 -Restarted on Lantus-slowly titrating Lantus dose at this time.  Plan is to discharge patient with Lantus and metformin.  Patient to be seen by endocrinologist as an outpatient discussed with pace  program physician in [place] -Diabetes coordinator input appreciated  2.  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  Discussed with pace program PA and physician -Outpatient nerve conduction velocities and EMG of the lower extremities -Okay to discharge patient from neurology standpoint  thiamine, B12, RPR and heavy metal screen  3.  Primary hyperparathyroidism-continue to monitor calcium  4.  Hypertension- losartan continued to help with proteinuria Hold home medication Lasix  5.  GERD-Protonix  6. Complex left renal mass-found incidentally on CT. appreciate urology input.  MRI of the abdomen has reviewed and surgery consulted as recommended by urology Patient was seen by Dr. Dahlia Byes for gallbladder stones-incidental finding, no surgical interventions needed as the patient is asymptomatic.  Outpatient follow-up as needed  patient will follow-up with urology dr.stoioff as outpatient  7.  Abdominal pain-CT of the abdomen showing colonic ileus.   Improved after bowel movement and patient passing gas. -KUB showing improvement .  Tolerating advanced diet Physical therapy consulted-recommended rehab at discharge Updating sister every day- is interested in sending patient to rehab [facility] due to lack of enough help at home Plan of care discussed with the patient sister and physician and PA at pace program.  DISCHARGE CONDITIONS:   fair  CONSULTS OBTAINED:  Treatment Team:  Alexis Goodell, MD   PROCEDURES   DRUG ALLERGIES:   Allergies  Allergen Reactions  . Atorvastatin Other (See Comments)    Muscle pain Muscle pain     DISCHARGE MEDICATIONS:   Allergies as of 09/22/2018      Reactions   Atorvastatin Other (See Comments)   Muscle pain Muscle pain      Medication List    STOP taking these medications    Dulaglutide 0.75 MG/0.5ML Sopn   furosemide 20 MG tablet Commonly known as:  LASIX     TAKE these medications   acetaminophen 500 MG tablet Commonly known as:  TYLENOL Take 1,000 mg by mouth daily.   aspirin EC 81 MG tablet Take 81 mg by mouth daily.   Desitin 40 % Pste Generic drug:  Zinc Oxide Apply 1 application topically 2 (two) times a day.   dorzolamide-timolol 22.3-6.8 MG/ML ophthalmic solution Commonly known as:  COSOPT Place 1 drop into both eyes 2 (two) times daily.   insulin aspart 100 UNIT/ML injection Commonly known as:  novoLOG Inject 4 Units into the skin 3 (three) times daily with meals.   insulin glargine 100 UNIT/ML injection Commonly known as:  LANTUS Inject 0.17 mLs (17 Units total) into the skin daily. Start taking on:  Sep 23, 2018   losartan 25 MG tablet Commonly known as:  COZAAR Take 25 mg by mouth daily.   magnesium oxide 400 (241.3 Mg) MG tablet Commonly known as:  MAG-OX Take 400 mg by mouth 2 (two) times daily.   metFORMIN 500 MG tablet Commonly known as:  GLUCOPHAGE Take 1,000 mg by mouth 2 (two) times a day.   omeprazole 20 MG capsule Commonly known as:  PRILOSEC Take 20 mg by mouth daily.   oxyCODONE 5 MG immediate release tablet Commonly known as:  Oxy IR/ROXICODONE Take 1 tablet (5 mg total) by mouth every 6 (  six) hours as needed for moderate pain.   polyethylene glycol 17 g packet Commonly known as:  MIRALAX / GLYCOLAX Take 17 g by mouth daily.   simethicone 80 MG chewable tablet Commonly known as:  MYLICON Chew 1 tablet (80 mg total) by mouth 4 (four) times daily.   Travatan Z 0.004 % Soln ophthalmic solution Generic drug:  Travoprost (BAK Free) Place 1 drop into both eyes every evening.   Vitamin D3 25 MCG (1000 UT) Caps Take 1,000 Units by mouth daily.            Durable Medical Equipment  (From admission, onward)         Start     Ordered   09/20/18 0803  For home use only DME Walker rolling  Once     Comments:  Walker with 5 inch wheels-3 in 1  Question:  Patient needs a walker to treat with the following condition  Answer:  Weakness   09/20/18 0803           DISCHARGE INSTRUCTIONS:   Follow-up with primary care physician at the facility in 2 to 3 days Follow-up with neurology Dr. Sherre Scarlet frequent falls/ambulatory dysfunction, outpatient nerve conduction velocities and EMG Neurosurgery-in 1 to 2 weeks pace program will arrange it Outpatient endocrinology follow-up at earliest possible in 4 to 5 days-PACE program agreed to coordinate the care  DIET:  Cardiac diet and Diabetic diet  DISCHARGE CONDITION:  Fair  ACTIVITY:  Activity as tolerated  OXYGEN:  Home Oxygen: No.   Oxygen Delivery: room air  DISCHARGE LOCATION:  nursing home   If you experience worsening of your admission symptoms, develop shortness of breath, life threatening emergency, suicidal or homicidal thoughts you must seek medical attention immediately by calling 911 or calling your MD immediately  if symptoms less severe.  You Must read complete instructions/literature along with all the possible adverse reactions/side effects for all the Medicines you take and that have been prescribed to you. Take any new Medicines after you have completely understood and accpet all the possible adverse reactions/side effects.   Please note  You were cared for by a hospitalist during your hospital stay. If you have any questions about your discharge medications or the care you received while you were in the hospital after you are discharged, you can call the unit and asked to speak with the hospitalist on call if the hospitalist that took care of you is not available. Once you are discharged, your primary care physician will handle any further medical issues. Please note that NO REFILLS for any discharge medications will be authorized once you are discharged, as it is imperative that you return to your primary care  physician (or establish a relationship with a primary care physician if you do not have one) for your aftercare needs so that they can reassess your need for medications and monitor your lab values.     Today  No chief complaint on file.  Patient is doing okay no overnight incidents.  Brittle diabetic with fluctuating blood sugars, needs outpatient endocrinology follow-up Discussed with patient sister and and pace program physician Dr. Ovid Curd as well as PA  ROS:  Unobtainable  VITAL SIGNS:  Blood pressure (!) 105/55, pulse 82, temperature 97.9 F (36.6 C), resp. rate 16, height 5\' 6"  (1.676 m), weight 76.1 kg, SpO2 97 %.  I/O:    Intake/Output Summary (Last 24 hours) at 09/22/2018 1227 Last data filed at 09/22/2018 0938 Gross per 24 hour  Intake 720 ml  Output 400 ml  Net 320 ml    PHYSICAL EXAMINATION:  GENERAL:  73 y.o.-year-old patient lying in the bed with no acute distress.  EYES: Pupils equal, round, reactive to light and accommodation. No scleral icterus. Extraocular muscles intact.  HEENT: Head atraumatic, normocephalic. Oropharynx and nasopharynx clear.  NECK:  Supple, no jugular venous distention. No thyroid enlargement, no tenderness.  LUNGS: Normal breath sounds bilaterally, no wheezing, rales,rhonchi or crepitation. No use of accessory muscles of respiration.  CARDIOVASCULAR: S1, S2 normal. No murmurs, rubs, or gallops.  ABDOMEN: Soft, non-tender, non-distended. Bowel sounds present.  EXTREMITIES: No pedal edema, cyanosis, or clubbing.  NEUROLOGIC: Awake and alert, oriented-1 sensation intact. Gait not checked.  PSYCHIATRIC: The patient is alert and oriented x 1.  SKIN: No obvious rash, lesion, or ulcer.   DATA REVIEW:   CBC Recent Labs  Lab 09/16/18 0532  WBC 7.2  HGB 11.8*  HCT 35.9*  PLT 294    Chemistries  Recent Labs  Lab 09/15/18 1747  09/18/18 0518  09/20/18 0425  NA 133*   < > 128*   < > 136  K 4.6   < > 4.3   < > 4.3  CL 94*   < > 96*   <  > 104  CO2 16*   < > 23   < > 24  GLUCOSE 385*   < > 250*   < > 78  BUN 23   < > 12   < > 9  CREATININE 1.52*   < > 0.94   < > 0.85  CALCIUM 10.9*   < > 10.2   < > 10.5*  MG 1.7   < > 1.7  --   --   AST 16  --   --   --   --   ALT 19  --   --   --   --   ALKPHOS 79  --   --   --   --   BILITOT 1.5*  --   --   --   --    < > = values in this interval not displayed.    Cardiac Enzymes No results for input(s): TROPONINI in the last 168 hours.  Microbiology Results  Results for orders placed or performed during the hospital encounter of 09/15/18  Urine culture     Status: None   Collection Time: 09/15/18  6:16 PM  Result Value Ref Range Status   Specimen Description   Final    URINE, RANDOM Performed at [facility], [address] [place], [place] [address]    Special Requests   Final    NONE Performed at [facility], [address] [place], [place] [address]    Culture   Final    Multiple bacterial morphotypes present, none predominant. Suggest appropriate recollection if clinically indicated.   Report Status 09/17/2018 FINAL  Final  Novel Coronavirus, NAA (hospital order; send-out to ref lab)     Status: None   Collection Time: 09/18/18  9:40 AM  Result Value Ref Range Status   SARS-CoV-2, NAA NOT DETECTED NOT DETECTED Final    Comment: (NOTE) This test was developed and its performance characteristics determined by [employer]. This test has not been FDA cleared or approved. This test has been authorized by FDA under an Emergency Use Authorization (EUA). This test is only authorized for the duration of time the declaration that circumstances exist justifying the  authorization of the emergency use of in vitro diagnostic tests for detection of SARS-CoV-2 virus and/or diagnosis of COVID-19 infection under section 564(b)(1) of the Act, 21 U.S.C. 902IOX-7(D)(5), unless the authorization is terminated or revoked sooner. When diagnostic testing  is negative, the possibility of a false negative result should be considered in the context of a patient's recent exposures and the presence of clinical signs and symptoms consistent with COVID-19. An individual without symptoms of COVID-19 and who is not shedding SARS-CoV-2 virus would expect to have a negative (not detected) result in this assay. Performed  At: [facility] [address] [place], [place] [id number] Rush Farmer MD [id number]    [place]  Final    Comment: Performed at [facility], [address] [place], [place] [address]    RADIOLOGY:  Bea Graff 1 View  Result Date: 09/19/2018 CLINICAL DATA:  Ileus EXAM: ABDOMEN - 1 VIEW COMPARISON:  Abdominal CT from 2 days ago FINDINGS: Previously administered oral contrast is seen within ascending and transverse colon. No notable small bowel dilatation. No concerning mass effect or gas collection. IMPRESSION: Oral contrast administered 2 days has progressed into the colon. The colon is less distended than on prior CT. Electronically Signed   By: Monte Fantasia M.D.   On: 09/19/2018 08:38   Mr Brain Wo Contrast  Result Date: 09/20/2018 CLINICAL DATA:  Generalized weakness. EXAM: MRI HEAD WITHOUT CONTRAST TECHNIQUE: Multiplanar, multiecho pulse sequences of the brain and surrounding structures were obtained without intravenous contrast. 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 Diffusion sequences are nondiagnostic limiting assessment for acute infarct. FLAIR sequences are also severely degraded and of limited diagnostic utility. Generalized cerebral atrophy is mild for age. T2 hyperintensities are most notable in the periventricular white matter and are nonspecific but compatible with mild chronic small vessel ischemic disease. No midline shift or gross extra-axial fluid collection is identified.  Vascular: Major arterial flow voids at the base of the brain are grossly preserved. Skull and upper cervical spine: No suspicious marrow lesion identified. Sinuses/Orbits: Bilateral cataract extraction. Clear paranasal sinuses. Clear mastoid scar site clear left mastoid air cells. Other: None. IMPRESSION: 1. Limited examination due to extensive artifact from cochlear implant. Nondiagnostic assessment for acute infarct. 2. Mild chronic small vessel ischemic disease and cerebral atrophy. Electronically Signed   By: Logan Bores M.D.   On: 09/20/2018 21:08   Mr Lumbar Spine Wo Contrast  Result Date: 09/21/2018 CLINICAL DATA:  Muscle weakness.  Multiple falls. EXAM: MRI LUMBAR SPINE WITHOUT CONTRAST TECHNIQUE: Multiplanar, multisequence MR imaging of the lumbar spine was performed. No intravenous contrast was administered. COMPARISON:  None. FINDINGS: Segmentation:  Standard. Alignment:  Minimal grade 1 anterolisthesis of L4 on L5. Vertebrae:  No fracture, evidence of discitis, or bone lesion. Conus medullaris and cauda equina: Conus extends to the T12 level. Conus and cauda equina appear normal. Paraspinal and other soft tissues: No acute paraspinal abnormality. 4.5 cm complex left renal mass most concerning for renal cell carcinoma. Disc levels: Disc spaces: Degenerative disease with disc height loss at L5-S1. T12-L1: No significant disc bulge. No evidence of neural foraminal stenosis. No central canal stenosis. L1-L2: Minimal broad-based disc bulge. Mild bilateral facet arthropathy. No evidence of neural foraminal stenosis. No central canal stenosis. L2-L3: Minimal broad-based disc bulge. Mild bilateral facet arthropathy. No evidence of neural foraminal stenosis. No central canal stenosis. L3-L4: Minimal broad-based disc bulge. Moderate left facet  arthropathy. No evidence of neural foraminal stenosis. No central canal stenosis. L4-L5: Mild broad-based disc bulge. Severe bilateral facet arthropathy with ligamentum  flavum infolding. Moderate-severe spinal stenosis and bilateral lateral recess stenosis. No evidence of neural foraminal stenosis. L5-S1: Broad-based disc bulge. Moderate bilateral facet arthropathy. Mild spinal stenosis. Moderate right foraminal stenosis. Minimal left foraminal stenosis. No central canal stenosis. IMPRESSION: 1. Diffuse lumbar spine spondylosis as described above. 2. At L4-5 there is a mild broad-based disc bulge. Severe bilateral facet arthropathy with ligamentum flavum infolding. Moderate-severe spinal stenosis and bilateral lateral recess stenosis. 3. 4.5 cm left renal mass most concerning for renal cell carcinoma. Electronically Signed   By: Kathreen Devoid   On: 09/21/2018 21:25   Mr Abdomen W Wo Contrast  Result Date: 09/21/2018 CLINICAL DATA:  Inpatient. Indeterminate left renal mass on recent CT. History of right breast cancer status post mastectomy. EXAM: MRI ABDOMEN WITHOUT AND WITH CONTRAST TECHNIQUE: Multiplanar multisequence MR imaging of the abdomen was performed both before and after the administration of intravenous contrast. CONTRAST:  7 cc Gadavist IV. COMPARISON:  09/17/2018 CT abdomen/pelvis. FINDINGS: Limited motion degraded scan. Lower chest: No acute abnormality at the lung bases. Hepatobiliary: Normal liver size and configuration. No hepatic steatosis. Subcentimeter simple anterior segment 4A left liver lobe cyst. No suspicious liver masses. There is a 10 mm gallstone at the gallbladder neck with gallbladder distention (gallbladder diameter 4.8 cm). No gallbladder wall thickening. Trace pericholecystic fluid. No biliary ductal dilatation. Common bile duct diameter 5 mm. There is a small to moderate periampullary duodenal diverticulum. No evidence of choledocholithiasis. No biliary strictures or masses. Pancreas: No pancreatic mass or duct dilation.  No pancreas divisum. Spleen: Normal size. No mass. Adrenals/Urinary Tract: Normal adrenals. No hydronephrosis. 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 There is a simple 1.3 cm renal cyst in the lower right kidney. Stomach/Bowel: Normal non-distended stomach. Unremarkable visualized small bowel with no small bowel wall thickening. Diffuse large bowel distention by prominent gas and stool without appreciable large bowel wall thickening. Vascular/Lymphatic: Atherosclerotic nonaneurysmal abdominal aorta. Patent portal, splenic, hepatic and renal veins. No pathologically enlarged lymph nodes in the abdomen. Other: No abdominal ascites or focal fluid collection. Musculoskeletal: No aggressive appearing focal osseous lesions. IMPRESSION: 1. 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 No evidence of renal vein tumor thrombus. 2. No findings of metastatic disease in the abdomen. 3. Gallbladder neck 10 mm stone with gallbladder distention and trace pericholecystic fluid. No gallbladder wall thickening. Right upper quadrant abdominal ultrasound or hepatobiliary scintigraphy could be obtained for further evaluation if there is clinical concern for acute cholecystitis. 4. No biliary ductal dilatation. CBD diameter 5 mm. No evidence choledocholithiasis. 5. Diffuse large bowel distention by prominent gas and stool, suggestive of adynamic ileus. 6.  Aortic Atherosclerosis (ICD10-I70.0). Electronically Signed   By: Ilona Sorrel M.D.   On: 09/21/2018 07:55    EKG:  No orders found for this or any previous visit.    Management plans discussed with the patient, sister, [facility] program physician and PA and they are in agreement.  CODE STATUS:     Code Status Orders  (From  admission, onward)  Start     Ordered   09/15/18 1653  Full code  Continuous     09/15/18 1654        Code Status History    This patient has a current code status but no historical code status.    Advance Directive Documentation     Most Recent Value  Type of Advance Directive  Healthcare Power of Attorney  Pre-existing out of facility DNR order (yellow form or pink MOST form)  -  "MOST" Form in Place?  -      TOTAL TIME TAKING CARE OF THIS PATIENT:  43  minutes.   Note: This dictation was prepared with Dragon dictation along with smaller phrase technology. Any transcriptional errors that result from this process are unintentional.   @[facility] @  on 09/22/2018 at 12:27 PM  Between 7am to 6pm - Pager - [phone]  After 6pm go to [email or web address] - password [email or web address] [facility]  Office  [phone]  CC: Primary care physician; [place]

## 2018-09-22 NOTE — Progress Notes (Signed)
New order give 12 units once and 2 units scheduled-total 14 units novolog and scheduled lantus, check BS every 1 hours x2, per Dr Margaretmary Eddy

## 2018-09-22 NOTE — Progress Notes (Signed)
Report called to [place] at [place], pt to be transported by [facility], awaiting transport

## 2018-09-25 ENCOUNTER — Telehealth: Payer: Self-pay | Admitting: Urology

## 2018-09-25 NOTE — Telephone Encounter (Signed)
App made and mailed to patient 

## 2018-09-25 NOTE — Telephone Encounter (Signed)
-----   Message from Abbie Sons, MD sent at 09/21/2018 10:02 AM EDT ----- Regarding: hosp f/u Needs f/u appt with [facility] or [facility] for renal mass

## 2018-09-26 LAB — VITAMIN B1: Vitamin B1 (Thiamine): 64.7 nmol/L — ABNORMAL LOW (ref 66.5–200.0)

## 2018-10-25 ENCOUNTER — Ambulatory Visit: Payer: Self-pay | Admitting: Urology

## 2018-10-25 ENCOUNTER — Encounter: Payer: Self-pay | Admitting: Urology

## 2018-11-12 ENCOUNTER — Telehealth: Payer: Self-pay | Admitting: Urology

## 2018-11-12 NOTE — Telephone Encounter (Signed)
This patient was a no show for f/u renal mass.  She definitely needs to be seen/ rescheduled.  I believe she has hearing loss and possibly developmental delay.  It appears that she is a patient of [facility].  Please help get her in ASAP.  Hollice Espy, MD

## 2018-11-21 ENCOUNTER — Encounter: Payer: Self-pay | Admitting: Urology

## 2018-11-21 ENCOUNTER — Other Ambulatory Visit: Payer: Self-pay

## 2018-11-21 ENCOUNTER — Ambulatory Visit (INDEPENDENT_AMBULATORY_CARE_PROVIDER_SITE_OTHER): Payer: Medicare (Managed Care) | Admitting: Urology

## 2018-11-21 VITALS — BP 128/64 | HR 66 | Ht 66.0 in

## 2018-11-21 DIAGNOSIS — N2889 Other specified disorders of kidney and ureter: Secondary | ICD-10-CM

## 2018-11-21 NOTE — Progress Notes (Signed)
11/21/2018 11:24 AM   Kim Nunez 07-08-1945 [id number]  Referring provider: [facility] [address] [place],  [place] [address]  Chief Complaint  Patient presents with  . Renal Mass    New Patient    HPI: 73 year old female with a history of hearing impairment and developmental delay who presents today to the office for further discussion of incidental left upper pole renal mass.  She was admitted to the hospital in May 2020 with an episode of hyperglycemia.  As part of her evaluation, she underwent CT abdomen pelvis with contrast which revealed a 4.9 cm left upper pole renal mass which appeared to be new since 2006.  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  No lymphadenopathy was appreciated.  Unfortunately today, the patient presents unaccompanied without her healthcare proxy or attendant from her assisted living facility.  The patient is unable to provide any additional history today.   PMH: Past Medical History:  Diagnosis Date  . Breast cancer ([place])   . Developmental delay   . Diabetes mellitus type 2 with complications, uncontrolled ([place])   . Hyperlipidemia   . Hyperparathyroidism ([place])   . Hypertension     Surgical History: Past Surgical History:  Procedure Laterality Date  . right breast masectomy      Home Medications:  Allergies as of 11/21/2018      Reactions   Atorvastatin Other (See Comments)   Muscle pain Muscle pain      Medication List       Accurate as of November 21, 2018 11:24 AM. If you have any questions, ask your nurse or doctor.        acetaminophen 500 MG tablet Commonly known as: TYLENOL Take 1,000 mg by mouth daily.   aspirin EC 81 MG tablet Take 81 mg by mouth daily.   Desitin 40 % Pste  Generic drug: Zinc Oxide Apply 1 application topically 2 (two) times a day.   dorzolamide-timolol 22.3-6.8 MG/ML ophthalmic solution Commonly known as: COSOPT Place 1 drop into both eyes 2 (two) times daily.   insulin aspart 100 UNIT/ML injection Commonly known as: novoLOG Inject 4 Units into the skin 3 (three) times daily with meals.   insulin glargine 100 UNIT/ML injection Commonly known as: LANTUS Inject 0.17 mLs (17 Units total) into the skin daily.   losartan 25 MG tablet Commonly known as: COZAAR Take 25 mg by mouth daily.   magnesium oxide 400 (241.3 Mg) MG tablet Commonly known as: MAG-OX Take 400 mg by mouth 2 (two) times daily.   metFORMIN 500 MG tablet Commonly known as: GLUCOPHAGE Take 1,000 mg by mouth 2 (two) times a day.   omeprazole 20 MG capsule Commonly known as: PRILOSEC Take 20 mg by mouth daily.   oxyCODONE 5 MG immediate release tablet Commonly known as: Oxy IR/ROXICODONE Take 1 tablet (5 mg total) by mouth every 6 (six) hours as needed for moderate pain.   polyethylene glycol 17 g packet Commonly known as: MIRALAX / GLYCOLAX Take 17 g by mouth daily.   simethicone 80 MG chewable tablet Commonly known as: MYLICON Chew 1 tablet (80 mg total) by mouth 4 (four) times daily.   Travatan Z 0.004 % Soln ophthalmic solution Generic drug: Travoprost (BAK Free) Place 1 drop  into both eyes every evening.   Vitamin D3 25 MCG (1000 UT) Caps Take 1,000 Units by mouth daily.       Allergies:  Allergies  Allergen Reactions  . Atorvastatin Other (See Comments)    Muscle pain Muscle pain     Family History: No family history on file.  Social History:  reports that she has never smoked. She has never used smokeless tobacco. She reports that she does not drink alcohol or use drugs.  ROS: UROLOGY Frequent Urination?: No Hard to postpone urination?: No Burning/pain with urination?: No Get up at night to urinate?: No Leakage of urine?: Yes  Urine stream starts and stops?: No Trouble starting stream?: No Do you have to strain to urinate?: No Blood in urine?: No Urinary tract infection?: No Sexually transmitted disease?: No Injury to kidneys or bladder?: No Painful intercourse?: No Weak stream?: No Currently pregnant?: No Vaginal bleeding?: No Last menstrual period?: n  Gastrointestinal Nausea?: No Vomiting?: No Indigestion/heartburn?: No Diarrhea?: No Constipation?: No  Constitutional Fever: No Night sweats?: No Weight loss?: No Fatigue?: No  Skin Skin rash/lesions?: No Itching?: No  Eyes Blurred vision?: No Double vision?: No  Ears/Nose/Throat Sore throat?: No Sinus problems?: No  Hematologic/Lymphatic Swollen glands?: No Easy bruising?: No  Cardiovascular Leg swelling?: No Chest pain?: No  Respiratory Cough?: No Shortness of breath?: No  Endocrine Excessive thirst?: No  Musculoskeletal Back pain?: No Joint pain?: No  Neurological Headaches?: No Dizziness?: No  Psychologic Depression?: No Anxiety?: No  Physical Exam: BP 128/64   Pulse 66   Ht 5\' 6"  (1.676 m)   BMI 27.08 kg/m   Constitutional:  Alert and oriented, No acute distress.  In wheelchair.  Extremely hard of hearing. HEENT: [place] AT, moist mucus membranes.  Trachea midline, no masses. Cardiovascular: No clubbing, cyanosis, or edema. Respiratory: Normal respiratory effort, no increased work of breathing. Skin: No rashes, bruises or suspicious lesions. Neurologic: Grossly intact, no focal deficits, moving all 4 extremities.  Laboratory Data: Lab Results  Component Value Date   WBC 7.2 09/16/2018   HGB 11.8 (L) 09/16/2018   HCT 35.9 (L) 09/16/2018   MCV 79.6 (L) 09/16/2018   PLT 294 09/16/2018    Lab Results  Component Value Date   CREATININE 0.85 09/20/2018    Lab Results  Component Value Date   HGBA1C 10.7 (H) 09/16/2018    Urinalysis    Component Value Date/Time   COLORURINE YELLOW (A) 09/15/2018  1816   APPEARANCEUR CLEAR (A) 09/15/2018 1816   LABSPEC 1.014 09/15/2018 1816   PHURINE 5.0 09/15/2018 1816   GLUCOSEU >=500 (A) 09/15/2018 1816   HGBUR NEGATIVE 09/15/2018 1816   BILIRUBINUR NEGATIVE 09/15/2018 1816   KETONESUR 80 (A) 09/15/2018 1816   PROTEINUR NEGATIVE 09/15/2018 1816   NITRITE NEGATIVE 09/15/2018 1816   LEUKOCYTESUR NEGATIVE 09/15/2018 1816    Lab Results  Component Value Date   BACTERIA NONE SEEN 09/15/2018     Pertinent Imaging: CLINICAL DATA:  Abdominal distention with nausea and vomiting  EXAM: CT ABDOMEN AND PELVIS WITH CONTRAST  TECHNIQUE: Multidetector CT imaging of the abdomen and pelvis was performed using the standard protocol following bolus administration of intravenous contrast.  CONTRAST:  165mL OMNIPAQUE IOHEXOL 300 MG/ML  SOLN  COMPARISON:  None.  FINDINGS: Lower chest: Prominent bronchial lymph nodes size at the lung bases, not definitively pathologic based on coverage. Coronary atherosclerosis. No pericardial effusion.  Hepatobiliary: No focal hepatic lesion. Gallbladder without evidence of inflammation. There is a Research officer, trade [employer]  cap with small associated calcification.  Pancreas: Unremarkable.  Spleen: Unremarkable.  Adrenals/Urinary Tract: Negative adrenals. Bilateral renal cortical scarring. There is a heterogeneous (mainly low-density) but with areas of calcification and possible enhancement) mass in the upper pole left kidney measuring 4.9 cm. This was not described on a 2006 abdominal CT at [facility]. Unremarkable bladder.  Stomach/Bowel: The colon is diffusely dilated by gas and stool. There is rectal stool which is not convincingly obstructive. No appendicitis. No small bowel obstruction.  Vascular/Lymphatic: No acute vascular abnormality. No mass or adenopathy.  Reproductive:Minimal calcification in the uterus likely reflecting a fibroid.  Other: No ascites or pneumoperitoneum.  Musculoskeletal: No acute  abnormalities. Lumbar facet degeneration with L4-5 anterolisthesis. Advanced L5-S1 disc degeneration.  IMPRESSION: 1. Diffuse distention of the colon by gas and stool-possible colonic ileus. 2. 4.9 cm left renal mass that is complex and likely solid-new from a 2006 CT report at [facility] Recommend renal MRI. 3. Distended gallbladder without evidence of acute inflammation.   Electronically Signed   By: Monte Fantasia M.D.   On: 09/17/2018 14:18  CLINICAL DATA:  Inpatient. Indeterminate left renal mass on recent CT. History of right breast cancer status post mastectomy.  EXAM: MRI ABDOMEN WITHOUT AND WITH CONTRAST  TECHNIQUE: Multiplanar multisequence MR imaging of the abdomen was performed both before and after the administration of intravenous contrast.  CONTRAST:  7 cc Gadavist IV.  COMPARISON:  09/17/2018 CT abdomen/pelvis.  FINDINGS: Limited motion degraded scan.  Lower chest: No acute abnormality at the lung bases.  Hepatobiliary: Normal liver size and configuration. No hepatic steatosis. Subcentimeter simple anterior segment 4A left liver lobe cyst. No suspicious liver masses. There is a 10 mm gallstone at the gallbladder neck with gallbladder distention (gallbladder diameter 4.8 cm). No gallbladder wall thickening. Trace pericholecystic fluid. No biliary ductal dilatation. Common bile duct diameter 5 mm. There is a small to moderate periampullary duodenal diverticulum. No evidence of choledocholithiasis. No biliary strictures or masses.  Pancreas: No pancreatic mass or duct dilation.  No pancreas divisum.  Spleen: Normal size. No mass.  Adrenals/Urinary Tract: Normal adrenals. No hydronephrosis. There is a circumscribed partially exophytic 4.9 x 4.2 x 5.0 cm renal cortical mass in the posterior upper left kidney, which demonstrates T2 hypointensity, heterogeneous T1 hyperintensity and suggestion of low level enhancement, most suggestive of a  papillary renal cell carcinoma. There is a simple 1.3 cm renal cyst in the lower right kidney.  Stomach/Bowel: Normal non-distended stomach. Unremarkable visualized small bowel with no small bowel wall thickening. Diffuse large bowel distention by prominent gas and stool without appreciable large bowel wall thickening.  Vascular/Lymphatic: Atherosclerotic nonaneurysmal abdominal aorta. Patent portal, splenic, hepatic and renal veins. No pathologically enlarged lymph nodes in the abdomen.  Other: No abdominal ascites or focal fluid collection.  Musculoskeletal: No aggressive appearing focal osseous lesions.  IMPRESSION: 1. 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 No evidence of renal vein tumor thrombus. 2. No findings of metastatic disease in the abdomen. 3. Gallbladder neck 10 mm stone with gallbladder distention and trace pericholecystic fluid. No gallbladder wall thickening. Right upper quadrant abdominal ultrasound or hepatobiliary scintigraphy could be obtained for further evaluation if there is clinical concern for acute cholecystitis. 4. No biliary ductal dilatation. CBD diameter 5 mm. No evidence choledocholithiasis. 5. Diffuse large bowel distention by prominent gas and stool, suggestive of adynamic ileus. 6.  Aortic Atherosclerosis (  ICD10-I70.0).   Electronically Signed   By: Ilona Sorrel M.D.  CT and MRI images were personally reviewed today.  I spent  about 20 minutes on the phone with radiology getting the images uploaded to the epic interface as the patient was listed under a different name a, Shamiyah Ngu rather in PACS.  Agree with radiologic interpretation.   Assessment & Plan:    1. Left renal mass 5 cm left upper pole renal mass highly concerning for malignancy without any obvious signs of metastatic disease    Given her age and multiple medical comorbidities, surgery could be an option however is less desirable given the morbidity of the procedure.  Observation would also be an option.  Case was presented at tumor board discussed various treatment options including percutaneous intervention.  Tumor conversation was followed up specifically with Dr. Kathlene Cote who believes that based on the location of the lesion it would be amenable to percutaneous intervention, however may require more than 1 intervention based on the size.    I was finally able to identify the patient's healthcare proxy, [name] have a conversation with her by telephone.  We discussed the various options.  She is most interested in the option of percutaneous intervention in the form of cryoablation.  We will arrange for her to have a discussion with Dr. Kathlene Cote in the near future.  Risk and benefits of each of the treatment options were discussed in detail as well.  Finally, I discussed the patient as well as recommendations with her primary care physician, Luisa Dago, who is agreeable this plan.  The patient is a patient of the [facility].   Hollice Espy, MD  [facility] [address], [place], [place] [address] [phone]  I spent 60 min with this patient of which greater than 50% was spent in counseling and coordination of care with the patient.

## 2018-11-30 ENCOUNTER — Other Ambulatory Visit: Payer: Medicare (Managed Care)

## 2018-11-30 NOTE — Progress Notes (Signed)
Tumor Board Documentation  Kim Nunez was presented by Dr Iantha Fallen at our Tumor Board on 11/30/2018, which included representatives from medical oncology, radiation oncology, pathology, radiology, surgical, surgical oncology, navigation, internal medicine, pharmacy, pulmonology, palliative care, research.  Kim Nunez currently presents as an external consult, for discussion with history of the following treatments: active survellience.  Additionally, we reviewed previous medical and familial history, history of present illness, and recent lab results along with all available histopathologic and imaging studies. The tumor board considered available treatment options and made the following recommendations:   Send patient for ablation of renal mass  The following procedures/referrals were also placed: No orders of the defined types were placed in this encounter.   Clinical Trial Status: not discussed   Staging used: Not Applicable  National site-specific guidelines   were discussed with respect to the case.  Tumor board is a meeting of clinicians from various specialty areas who evaluate and discuss patients for whom a multidisciplinary approach is being considered. Final determinations in the plan of care are those of the provider(s). The responsibility for follow up of recommendations given during tumor board is that of the provider.   Today's extended care, comprehensive team conference, [place] was not present for the discussion and was not examined.   Multidisciplinary Tumor Board is a multidisciplinary case peer review process.  Decisions discussed in the Multidisciplinary Tumor Board reflect the opinions of the specialists present at the conference without having examined the patient.  Ultimately, treatment and diagnostic decisions rest with the primary provider(s) and the patient.

## 2018-12-05 ENCOUNTER — Other Ambulatory Visit: Payer: Self-pay | Admitting: Urology

## 2018-12-05 DIAGNOSIS — N2889 Other specified disorders of kidney and ureter: Secondary | ICD-10-CM

## 2018-12-07 ENCOUNTER — Ambulatory Visit
Admission: RE | Admit: 2018-12-07 | Discharge: 2018-12-07 | Disposition: A | Discharge status: Home / Self Care | Payer: Medicare (Managed Care) | Source: Ambulatory Visit | Attending: Urology | Admitting: Urology

## 2018-12-07 ENCOUNTER — Other Ambulatory Visit: Payer: Self-pay

## 2018-12-07 ENCOUNTER — Encounter: Payer: Self-pay | Admitting: *Deleted

## 2018-12-07 DIAGNOSIS — N2889 Other specified disorders of kidney and ureter: Secondary | ICD-10-CM

## 2018-12-07 HISTORY — PX: IR RADIOLOGIST EVAL & MGMT: IMG5224

## 2018-12-07 NOTE — Consult Note (Signed)
Chief Complaint: Patient was consulted remotely today (TeleHealth) for ablation of a left renal mass at the request of [facility].    Referring Physician(s): Brandon,Ashley  History of Present Illness: Kim Nunez is a 73 y.o. female with detection of a 4.9 cm left renal mass by abdominal CT on 09/17/2018 at the time of admission to the hospital for abdominal distention, nausea, vomiting and management of uncontrolled diabetes.  MRI was performed on 09/20/2018 demonstrating a 4.9 x 4.2 x 5.0 cm mass centered in the upper to mid posterior left renal cortex with low-level internal enhancement suggestive of a papillary renal carcinoma.  Kim Nunez was discharged from the hospital on 09/22/2018.  She is currently residing in a rehabilitation nursing facility.  She was living alone prior to hospitalization in May.  Her Sister Kim Nunez lives close by and is power of attorney.  She has a history of developmental delay.  Current level of activity is walking with a walker typically only to the bathroom or for short distances.  She has no urinary symptoms, abdominal pain or left flank pain.  Past Medical History:  Diagnosis Date  . Breast cancer ([place])   . Developmental delay   . Diabetes mellitus type 2 with complications, uncontrolled ([place])   . Hyperlipidemia   . Hyperparathyroidism ([place])   . Hypertension     Past Surgical History:  Procedure Laterality Date  . right breast masectomy      Allergies: Atorvastatin  Medications: Prior to Admission medications   Medication Sig Start Date End Date Taking? Authorizing Provider  acetaminophen (TYLENOL) 500 MG tablet Take 1,000 mg by mouth daily. 06/05/14   [provider]  aspirin EC 81 MG tablet Take 81 mg by mouth daily. 07/09/08   [provider]  Cholecalciferol (VITAMIN D3) 25 MCG (1000 UT) CAPS Take 1,000 Units by mouth daily. 05/24/18 05/24/19  [provider]  DESITIN 40 % PSTE Apply 1 application  topically 2 (two) times a day. 09/09/18   [provider]  dorzolamide-timolol (COSOPT) 22.3-6.8 MG/ML ophthalmic solution Place 1 drop into both eyes 2 (two) times daily. 06/17/16   [provider]  insulin aspart (NOVOLOG) 100 UNIT/ML injection Inject 4 Units into the skin 3 (three) times daily with meals. 09/22/18   Gouru, Illene Silver, MD  insulin glargine (LANTUS) 100 UNIT/ML injection Inject 0.17 mLs (17 Units total) into the skin daily. 09/23/18   Gouru, Illene Silver, MD  losartan (COZAAR) 25 MG tablet Take 25 mg by mouth daily. 08/11/16   [provider]  magnesium oxide (MAG-OX) 400 (241.3 Mg) MG tablet Take 400 mg by mouth 2 (two) times daily. 09/09/18   [provider]  metFORMIN (GLUCOPHAGE) 500 MG tablet Take 1,000 mg by mouth 2 (two) times a day. 07/06/16   [provider]  omeprazole (PRILOSEC) 20 MG capsule Take 20 mg by mouth daily. 08/11/16   [provider]  oxyCODONE (OXY IR/ROXICODONE) 5 MG immediate release tablet Take 1 tablet (5 mg total) by mouth every 6 (six) hours as needed for moderate pain. 09/22/18   Gouru, Illene Silver, MD  polyethylene glycol (MIRALAX / GLYCOLAX) 17 g packet Take 17 g by mouth daily. 08/15/17   [provider]  simethicone (MYLICON) 80 MG chewable tablet Chew 1 tablet (80 mg total) by mouth 4 (four) times daily. 09/22/18   Gouru, Illene Silver, MD  Travoprost, BAK Free, (TRAVATAN Z) 0.004 % SOLN ophthalmic solution Place 1 drop into both eyes every evening. 02/19/16  [provider]     No family history on file.  Social History   Socioeconomic History  . Marital status: Single    Spouse name: Not on file  . Number of children: Not on file  . Years of education: Not on file  . Highest education level: Not on file  Occupational History  . Not on file  Social Needs  . Financial resource strain: Not on file  . Food insecurity    Worry: Not on file    Inability: Not on file  . Transportation needs    Medical: Not  on file    Non-medical: Not on file  Tobacco Use  . Smoking status: Never Smoker  . Smokeless tobacco: Never Used  Substance and Sexual Activity  . Alcohol use: Never    Frequency: Never  . Drug use: Never  . Sexual activity: Not on file  Lifestyle  . Physical activity    Days per week: Not on file    Minutes per session: Not on file  . Stress: Not on file  Relationships  . Social Herbalist on phone: Not on file    Gets together: Not on file    Attends religious service: Not on file    Active member of club or organization: Not on file    Attends meetings of clubs or organizations: Not on file    Relationship status: Not on file  Other Topics Concern  . Not on file  Social History Narrative  . Not on file      Review of Systems  Constitutional: Negative.   Respiratory: Negative.   Cardiovascular: Negative.   Gastrointestinal: Negative.   Genitourinary: Negative.   Musculoskeletal: Positive for gait problem.  Neurological:       Developmental delay. Difficulty walking.    Review of Systems: A 12 point ROS discussed and pertinent positives are indicated in the HPI above.  All other systems are negative.  Physical Exam No direct physical exam was performed (except for noted visual exam findings with Video Visits).   Vital Signs: There were no vitals taken for this visit.  Imaging: No results found.  Labs:  CBC: Recent Labs    09/15/18 1747 09/16/18 0532  WBC 7.5 7.2  HGB 13.0 11.8*  HCT 40.9 35.9*  PLT 295 294    COAGS: No results for input(s): INR, APTT in the last 8760 hours.  BMP: Recent Labs    09/16/18 0532 09/18/18 0518 09/19/18 0407 09/20/18 0425  NA 138 128* 134* 136  K 3.4* 4.3 4.7 4.3  CL 102 96* 102 104  CO2 21* 23 22 24   GLUCOSE 56* 250* 60* 78  BUN 16 12 11 9   CALCIUM 10.5* 10.2 10.2 10.5*  CREATININE 1.05* 0.94 0.77 0.85  GFRNONAA 53* >60 >60 >60  GFRAA >60 >60 >60 >60    LIVER FUNCTION TESTS: Recent Labs     09/15/18 1747  BILITOT 1.5*  AST 16  ALT 19  ALKPHOS 79  PROT 7.1  ALBUMIN 4.0     Assessment and Plan:  I spoke by phone with Kim Nunez who is the patient's sister and power of attorney.  We discussed treatment options for the left renal mass.  Dr. Erlene Quan does not feel that the patient is a good candidate for partial nephrectomy or nephrectomy given her poorly controlled diabetes, developmental delay and other comorbidities.  Although the mass is very borderline to treat with percutaneous ablation based on  size and volume, it is possible to treat lesions up to around 5 cm with current cryoablation techniques.  With larger masses such as this, there is a higher risk of recurrence after an initial ablation procedure.  However, the vast majority of the mass should be able to be treated adequately and recurrence or residual tumor could be potentially treated with a second procedure, if necessary.  I told the patient sister that a recurrence may not even be evident for quite some time after initial treatment.  Details of percutaneous cryoablation were discussed with Kim Nunez including risks and technical details.  We also discussed the need for biopsy to establish a tissue diagnosis which can be performed at the time of ablation.  The procedure is performed under general anesthesia with overnight observation afterwards.  After discussion, Kim Nunez would like to proceed with cryoablation and concurrent biopsy of the left renal mass.  We will begin the scheduling process.  Thank you for this interesting consult.  I greatly enjoyed meeting Kim Nunez and look forward to participating in their care.  A copy of this report was sent to the requesting provider on this date.  Electronically Signed: Azzie Roup 12/07/2018, 3:24 PM     I spent a total of  30 Minutes  in remote  clinical consultation, greater than 50% of which was counseling/coordinating care for ablation of a  left renal mass.    Visit type: Audio only (telephone). Audio (no video) only due to inability to Engineer, technical sales. Alternative for in-person consultation at [facility], [address] [place], [place], [place]. This visit type was conducted due to national recommendations for restrictions regarding the COVID-19 Pandemic (e.g. social distancing).  This format is felt to be most appropriate for this patient at this time.  All issues noted in this document were discussed and addressed.

## 2018-12-11 ENCOUNTER — Other Ambulatory Visit (HOSPITAL_COMMUNITY): Payer: Self-pay | Admitting: Interventional Radiology

## 2018-12-11 DIAGNOSIS — N2889 Other specified disorders of kidney and ureter: Secondary | ICD-10-CM

## 2018-12-20 ENCOUNTER — Encounter (HOSPITAL_COMMUNITY): Payer: Self-pay | Admitting: *Deleted

## 2018-12-20 ENCOUNTER — Other Ambulatory Visit: Payer: Self-pay

## 2018-12-20 NOTE — Progress Notes (Addendum)
Preop instructions for:  Kim Nunez    Date of Birth [birth year]                            Date of Procedure:  12-27-2018      Doctor:DR YAMAGATA Time to arrive at [place] AM Report to: Admitting  Procedure:    Do not eat or drink past midnight the night before your procedure.(To include any tube feedings-must be discontinued)   Take these morning medications only with sips of water.(or give through gastrostomy or feeding tube). TAKE 1/2 DOSE AM LANTUS INSULIN (TAKE 10 UNITS), OMEPRAZOLE  Note: No Insulin or Diabetic meds should be given or taken the morning of the procedure!   Facility contact:    AMY,  RN      ZYYQM:[phone] [id number] [phone]                  Health Care POA: [name] SIGNS CONSENTS CELL [phone]  Transportation contact phone#: FACILITY AWARE PATIENT SPENDING NIGHT AND WILL PROVIDE TRANSPORTATION  Please send day of procedure:current med list and meds last taken that day, confirm nothing by mouth status from what time, Patient Demographic info( to include DNR status, problem list, allergies)    Bring Insurance card and picture ID Leave all jewelry and other valuables at place where living( no metal or rings to be worn) No contact lens Women-no make-up, no lotions,perfumes,powders Men-no colognes,lotions  Any questions day of procedure,call Endoscopy unit-[phone]!   Sent from :[facility] Presurgical Testing                   [place]                   [phone]  Sent by : Zelphia Cairo RN

## 2018-12-20 NOTE — Progress Notes (Signed)
SPOKE WITH SISTER DOROTHY FARRINGTON SISTER IS POA AND WILL COME 12-27-2018 AND SIGN CONSENT AT 900 [facility] ADMITTING.

## 2018-12-20 NOTE — Progress Notes (Signed)
RECORDS REQUESTED FROM AMY ON 12-19-2018 AND 12-20-2018 FROM NURSING HOME

## 2018-12-21 NOTE — Progress Notes (Signed)
FAXED PRE OP INSTRUCTIONS TO AMY RN AT [facility], SPOKE WITH SECRETARY BY PHONE AND AMY RN STATED RECEIVED ALL PRE OP INSTRUCTIONS AND UNDERSTOOD ALL INSTRUCTIONS.

## 2018-12-22 NOTE — Progress Notes (Signed)
[facility] [place] [facility] KEATHA BROOKS NP 10-02-18 ON CHART LABS DONE 12-21-18 CBC WITH DIF, CMET ON CHART

## 2018-12-27 ENCOUNTER — Inpatient Hospital Stay (HOSPITAL_COMMUNITY): Admission: RE | Admit: 2018-12-27 | Payer: Medicare (Managed Care) | Source: Ambulatory Visit

## 2019-01-05 NOTE — Progress Notes (Signed)
LM on VM [phone] to call back to get information Faxed to [place] from them. Machine did not identify themselves.

## 2019-01-08 NOTE — Progress Notes (Addendum)
RN CALLED [facility] AT [place]. SPOKE WITH AMY , NURSE CARING FOR PATIENT. REQUESTED UPDATED MAR. AMY TO SEND ASAP.     Update 1223: MAR received and placed on patient chart,  Pre-op instructions faxed to to Amy, RN . RN called patient POA [name] , lmtcb

## 2019-01-08 NOTE — Progress Notes (Signed)
Preop instructions for:  Kim Nunez "Kim Nunez"    Date of Birth [birth year]                         Date of Procedure:  01-10-2019       Doctor: DR .Kathlene Cote Time to arrive at [facility]: 8:45AM Report to: Admitting Procedure: CRYOABLATION AND BIOPSY    Do not eat or drink past midnight the night before your procedure.(To include any tube feedings-must be discontinued)  The night before surgery: hold bedtime dose of Humalog U-100 Insulin Lispro!  Take these morning medications only with sips of water.(or give through gastrostomy or feeding tube): -Omeprazole -Half dose of Lantus Insulin Glargine (10 units) -Humalog Insulin Lispro may only be taken if blood sugar is greater than 220; may only take half recommended dose if blood sugar greater than 220. [place] contact:    AMY,  RN      [id number] [phone]                   Health Care POA: Kim Nunez SIGNS CONSENTS CELL [phone]  Transportation contact phone#: FACILITY AWARE PATIENT SPENDING NIGHT AND WILL PROVIDE TRANSPORTATION  Please send day of procedure:current med list and meds last taken that day, confirm nothing by mouth status from what time, Patient Demographic info( to include DNR status, problem list, allergies)    Bring Insurance card and picture ID Leave all jewelry and other valuables at place where living( no metal or rings to be worn) No contact lens Women-no make-up, no lotions,perfumes,powders Men-no colognes,lotions  Any questions day of procedure,call: INTERVENTIONAL RADIOLOGY unit-[phone] OR CALL DR. YAMAGATA'S OFFICE !   Sent from :[facility] Presurgical Testing                   [place]                   Fax:[phone]  Sent by : Vita Erm,  RN

## 2019-01-09 ENCOUNTER — Other Ambulatory Visit: Payer: Self-pay | Admitting: Physician Assistant

## 2019-01-09 NOTE — Progress Notes (Signed)
RN contacted [facility] [place] . spoke with Crystal in transportation and confirmed that patient will need to arrive at [facility] at 0845am to admitting office and that patient is expected to stay over night and need transportation the next day . Crystal verbalized understanding and conformed transportation for patient.

## 2019-01-10 ENCOUNTER — Telehealth (HOSPITAL_COMMUNITY): Payer: Self-pay | Admitting: *Deleted

## 2019-01-10 ENCOUNTER — Ambulatory Visit (HOSPITAL_COMMUNITY): Payer: Medicare (Managed Care)

## 2019-01-10 ENCOUNTER — Other Ambulatory Visit (HOSPITAL_COMMUNITY): Payer: Medicare (Managed Care)

## 2019-02-07 ENCOUNTER — Other Ambulatory Visit: Payer: Self-pay

## 2019-02-07 ENCOUNTER — Encounter (HOSPITAL_COMMUNITY): Payer: Self-pay | Admitting: *Deleted

## 2019-02-07 NOTE — Progress Notes (Signed)
Records requested from amy at [place] [facility] for [id number] procedure

## 2019-02-07 NOTE — Progress Notes (Signed)
SPOKE WITH POA SISTER DOROTHY FARRINGTON AND SHE WILL ARRIVE 02-14-2019 [facility] ADMITTING TO SIGN CONSENT. SPOKE WITH EBONY AT [facility] AND THEY ARE AWARE PATIENT TO ARRIVE 900 AM 02-14-2019 [facility] ADMITTING AND PATIENT WILL SPEND NIGHT AND REQUIRE [facility] THE NEXT DAY.

## 2019-02-07 NOTE — Progress Notes (Addendum)
Preop instructions for:    Kim Nunez "[name]"     Date of Birth   07-Jun-1945                         Date of Procedure:  02-14-2019      Doctor: DR Kathlene Cote Time to arrive at [facility]: 900 AM  Report to: Admitting  Procedure: CRYOABLATION AND BIOPSY   Do not eat or drink past midnight the night before your procedure.(To include any tube feedings-must be discontinued)    Take these morning medications only with sips of water.(or give through gastrostomy or feeding tube). OMEPRAZOLE EYE DROP THE NIGHT BEFORE SURGERY HOLD BEDTIME DOSE OF HUMALOG U 100 LISPRO! TAKE 1/2 DOSE OF LANTUS INSULIN GLARGINE (TAKE 10 UNITS) AM 02-14-2019 HUMALOG INSULIN LISPRO MAY ONLY BE TAKEN IF BLOOD SUGAR IS GREATER THAN 220, MAY ONLY TAKE HALF OF RECOMMENDED DOSE IF BLOOD SUGAR GREATER THAN 220 ON 02-14-2019 NO METFORMIN AM 02-13-3029   Facility contact: AMY, RN     Phone:    [phone]              Health Care POA: [name] SIGNS CONSENTS  CELL [phone]  Transportation contact phone#:[facility] [phone]  Please send day of procedure:current med list and meds last taken that day, confirm nothing by mouth status from what time, Patient Demographic info( to include DNR status, problem list, allergies)   RN contact name/phone#:  [place] card and picture ID Leave all jewelry and other valuables at place where living( no metal or rings to be worn) No contact lens Women-no make-up, no lotions,perfumes,powders Men-no colognes,lotions  Any questions day of procedure,call  INTERVENTIONAL RADIOLOGY [phone]   Sent from :[facility] Presurgical Testing                   Phone:[phone]                   Fax:[phone]  Sent by : Zelphia Cairo               RN

## 2019-02-08 NOTE — Progress Notes (Signed)
[facility] Nunez at [facility] left message, all faxed instructions received and understood

## 2019-02-08 NOTE — Progress Notes (Addendum)
PCP - [facility] at [facility] [place] - none  Chest x-ray - none EKG - 12-25-18 on chart [facility] [place] - none ECHO - none Cardiac Cath - none  Sleep Study - none CPAP -   Fasting Blood Sugar - checked at nursing home Checks Blood Sugar _____ times a day  Blood Thinner Instructions: none Aspirin Instructions:81 mg aspirin no instructions given Last Dose:  Anesthesia review:   Patient denies shortness of breath, fever, cough and chest pain at PAT appointment   Patient verbalized understanding of instructions that were given to them at the PAT appointment. Patient was also instructed that they will need to review over the PAT instructions again at home before surgery.

## 2019-02-13 ENCOUNTER — Other Ambulatory Visit: Payer: Self-pay | Admitting: Radiology

## 2019-02-14 ENCOUNTER — Encounter (HOSPITAL_COMMUNITY)
Admission: RE | Disposition: A | Discharge status: Home / Self Care | Payer: Self-pay | Source: Home / Self Care | Attending: Interventional Radiology

## 2019-02-14 ENCOUNTER — Ambulatory Visit (HOSPITAL_COMMUNITY): Payer: Medicare (Managed Care) | Admitting: Certified Registered"

## 2019-02-14 ENCOUNTER — Ambulatory Visit (HOSPITAL_COMMUNITY): Payer: Medicare (Managed Care)

## 2019-02-14 ENCOUNTER — Encounter (HOSPITAL_COMMUNITY): Payer: Self-pay

## 2019-02-14 ENCOUNTER — Observation Stay (HOSPITAL_COMMUNITY)
Admission: RE | Admit: 2019-02-14 | Discharge: 2019-02-15 | Disposition: A | Discharge status: Home / Self Care | Payer: Medicare (Managed Care) | Attending: Interventional Radiology | Admitting: Interventional Radiology

## 2019-02-14 ENCOUNTER — Other Ambulatory Visit: Payer: Self-pay

## 2019-02-14 ENCOUNTER — Ambulatory Visit (HOSPITAL_COMMUNITY)
Admission: RE | Admit: 2019-02-14 | Discharge: 2019-02-14 | Disposition: A | Discharge status: Home / Self Care | Payer: Medicare (Managed Care) | Source: Ambulatory Visit | Attending: Interventional Radiology | Admitting: Interventional Radiology

## 2019-02-14 DIAGNOSIS — Z7982 Long term (current) use of aspirin: Secondary | ICD-10-CM | POA: Diagnosis not present

## 2019-02-14 DIAGNOSIS — E119 Type 2 diabetes mellitus without complications: Secondary | ICD-10-CM | POA: Insufficient documentation

## 2019-02-14 DIAGNOSIS — E785 Hyperlipidemia, unspecified: Secondary | ICD-10-CM | POA: Insufficient documentation

## 2019-02-14 DIAGNOSIS — Z9011 Acquired absence of right breast and nipple: Secondary | ICD-10-CM | POA: Insufficient documentation

## 2019-02-14 DIAGNOSIS — Z853 Personal history of malignant neoplasm of breast: Secondary | ICD-10-CM | POA: Diagnosis not present

## 2019-02-14 DIAGNOSIS — F0390 Unspecified dementia without behavioral disturbance: Secondary | ICD-10-CM | POA: Diagnosis not present

## 2019-02-14 DIAGNOSIS — C649 Malignant neoplasm of unspecified kidney, except renal pelvis: Principal | ICD-10-CM | POA: Insufficient documentation

## 2019-02-14 DIAGNOSIS — K219 Gastro-esophageal reflux disease without esophagitis: Secondary | ICD-10-CM | POA: Diagnosis not present

## 2019-02-14 DIAGNOSIS — N2889 Other specified disorders of kidney and ureter: Secondary | ICD-10-CM | POA: Diagnosis present

## 2019-02-14 DIAGNOSIS — E213 Hyperparathyroidism, unspecified: Secondary | ICD-10-CM | POA: Insufficient documentation

## 2019-02-14 DIAGNOSIS — Z794 Long term (current) use of insulin: Secondary | ICD-10-CM | POA: Insufficient documentation

## 2019-02-14 DIAGNOSIS — Z20828 Contact with and (suspected) exposure to other viral communicable diseases: Secondary | ICD-10-CM | POA: Insufficient documentation

## 2019-02-14 DIAGNOSIS — Z79899 Other long term (current) drug therapy: Secondary | ICD-10-CM | POA: Insufficient documentation

## 2019-02-14 DIAGNOSIS — Z01818 Encounter for other preprocedural examination: Secondary | ICD-10-CM

## 2019-02-14 DIAGNOSIS — I10 Essential (primary) hypertension: Secondary | ICD-10-CM | POA: Diagnosis not present

## 2019-02-14 HISTORY — DX: Unspecified hearing loss, unspecified ear: H91.90

## 2019-02-14 HISTORY — DX: Constipation, unspecified: K59.00

## 2019-02-14 HISTORY — PX: RADIOLOGY WITH ANESTHESIA: SHX6223

## 2019-02-14 HISTORY — DX: Unspecified dementia, unspecified severity, without behavioral disturbance, psychotic disturbance, mood disturbance, and anxiety: F03.90

## 2019-02-14 LAB — BASIC METABOLIC PANEL
Anion gap: 8 (ref 5–15)
BUN: 26 mg/dL — ABNORMAL HIGH (ref 8–23)
CO2: 25 mmol/L (ref 22–32)
Calcium: 11.1 mg/dL — ABNORMAL HIGH (ref 8.9–10.3)
Chloride: 104 mmol/L (ref 98–111)
Creatinine, Ser: 0.97 mg/dL (ref 0.44–1.00)
GFR calc Af Amer: >60 mL/min (ref >60)
GFR calc non Af Amer: 58 mL/min — ABNORMAL LOW (ref >60)
Glucose, Bld: 250 mg/dL — ABNORMAL HIGH (ref 70–99)
Potassium: 4.3 mmol/L (ref 3.5–5.1)
Sodium: 137 mmol/L (ref 135–145)

## 2019-02-14 LAB — GLUCOSE, CAPILLARY
Glucose-Capillary: 236 mg/dL — ABNORMAL HIGH (ref 70–99)
Glucose-Capillary: 174 mg/dL — ABNORMAL HIGH (ref 70–99)
Glucose-Capillary: 292 mg/dL — ABNORMAL HIGH (ref 70–99)

## 2019-02-14 LAB — SARS CORONAVIRUS 2 BY RT PCR (HOSPITAL ORDER, PERFORMED IN ~~LOC~~ HOSPITAL LAB): SARS Coronavirus 2: NEGATIVE

## 2019-02-14 LAB — HEMOGLOBIN A1C
Hgb A1c MFr Bld: 9 % — ABNORMAL HIGH (ref 4.8–5.6)
Mean Plasma Glucose: 211.6 mg/dL

## 2019-02-14 LAB — CBC
HCT: 37.6 % (ref 36.0–46.0)
Hemoglobin: 11.7 g/dL — ABNORMAL LOW (ref 12.0–15.0)
MCH: 25.8 pg — ABNORMAL LOW (ref 26.0–34.0)
MCHC: 31.1 g/dL (ref 30.0–36.0)
MCV: 82.8 fL (ref 80.0–100.0)
Platelets: 328 10*3/uL (ref 150–400)
RBC: 4.54 MIL/uL (ref 3.87–5.11)
RDW: 14.3 % (ref 11.5–15.5)
WBC: 4.9 10*3/uL (ref 4.0–10.5)
nRBC: 0 % (ref 0.0–0.2)

## 2019-02-14 LAB — PROTIME-INR
INR: 0.9 (ref 0.8–1.2)
Prothrombin Time: 11.8 seconds (ref 11.4–15.2)

## 2019-02-14 LAB — APTT: aPTT: 29 seconds (ref 24–36)

## 2019-02-14 SURGERY — CT WITH ANESTHESIA
Anesthesia: General | Laterality: Left

## 2019-02-14 MED ORDER — ONDANSETRON HCL 4 MG/2ML IJ SOLN: 4.0000 mg | Freq: Once | INTRAMUSCULAR | Status: DC | PRN

## 2019-02-14 MED ORDER — INSULIN ASPART 100 UNIT/ML ~~LOC~~ SOLN
SUBCUTANEOUS | Status: AC
  Filled 2019-02-14: qty 1

## 2019-02-14 MED ORDER — CEFAZOLIN SODIUM-DEXTROSE 2-4 GM/100ML-% IV SOLN
2.0000 g | Freq: Once | INTRAVENOUS | Status: AC
  Administered 2019-02-14: 2 g via INTRAVENOUS
  Filled 2019-02-14: qty 100

## 2019-02-14 MED ORDER — ONDANSETRON HCL 4 MG PO TABS: 4.0000 mg | ORAL_TABLET | Freq: Three times a day (TID) | ORAL | Status: DC | PRN

## 2019-02-14 MED ORDER — PHENYLEPHRINE 40 MCG/ML (10ML) SYRINGE FOR IV PUSH (FOR BLOOD PRESSURE SUPPORT)
PREFILLED_SYRINGE | INTRAVENOUS | Status: DC | PRN
  Administered 2019-02-14: 40 ug via INTRAVENOUS
  Administered 2019-02-14: 120 ug via INTRAVENOUS
  Administered 2019-02-14: 80 ug via INTRAVENOUS

## 2019-02-14 MED ORDER — OXYCODONE HCL 5 MG/5ML PO SOLN: 5.0000 mg | Freq: Once | ORAL | Status: DC | PRN

## 2019-02-14 MED ORDER — HYDROCODONE-ACETAMINOPHEN 5-325 MG PO TABS
1.0000 | ORAL_TABLET | ORAL | Status: DC | PRN
  Administered 2019-02-14: 1 via ORAL
  Filled 2019-02-14: qty 1

## 2019-02-14 MED ORDER — SODIUM CHLORIDE 0.9 % IV SOLN
INTRAVENOUS | Status: DC | PRN
  Administered 2019-02-14: 10 ug/min via INTRAVENOUS

## 2019-02-14 MED ORDER — FENTANYL CITRATE (PF) 100 MCG/2ML IJ SOLN
INTRAMUSCULAR | Status: DC | PRN
  Administered 2019-02-14 (×2): 50 ug via INTRAVENOUS

## 2019-02-14 MED ORDER — FENTANYL CITRATE (PF) 250 MCG/5ML IJ SOLN
INTRAMUSCULAR | Status: AC
  Filled 2019-02-14: qty 5

## 2019-02-14 MED ORDER — SIMETHICONE 80 MG PO CHEW
80.0000 mg | CHEWABLE_TABLET | Freq: Four times a day (QID) | ORAL | Status: DC
  Administered 2019-02-14 – 2019-02-15 (×3): 80 mg via ORAL
  Filled 2019-02-14 (×3): qty 1

## 2019-02-14 MED ORDER — MIDAZOLAM HCL 2 MG/2ML IJ SOLN
INTRAMUSCULAR | Status: AC
  Filled 2019-02-14: qty 2

## 2019-02-14 MED ORDER — LIDOCAINE 2% (20 MG/ML) 5 ML SYRINGE
INTRAMUSCULAR | Status: DC | PRN
  Administered 2019-02-14: 80 mg via INTRAVENOUS

## 2019-02-14 MED ORDER — LACTATED RINGERS IV SOLN
INTRAVENOUS | Status: DC | PRN
  Administered 2019-02-14: 13:00:00 via INTRAVENOUS

## 2019-02-14 MED ORDER — FENTANYL CITRATE (PF) 100 MCG/2ML IJ SOLN: 25.0000 ug | INTRAMUSCULAR | Status: DC | PRN

## 2019-02-14 MED ORDER — DOCUSATE SODIUM 100 MG PO CAPS
100.0000 mg | ORAL_CAPSULE | Freq: Two times a day (BID) | ORAL | Status: DC
  Administered 2019-02-14 – 2019-02-15 (×2): 100 mg via ORAL
  Filled 2019-02-14 (×4): qty 1

## 2019-02-14 MED ORDER — PANTOPRAZOLE SODIUM 40 MG PO TBEC
40.0000 mg | DELAYED_RELEASE_TABLET | Freq: Every day | ORAL | Status: DC
  Administered 2019-02-14 – 2019-02-15 (×2): 40 mg via ORAL
  Filled 2019-02-14: qty 1

## 2019-02-14 MED ORDER — PROPOFOL 10 MG/ML IV BOLUS
INTRAVENOUS | Status: DC | PRN
  Administered 2019-02-14: 130 mg via INTRAVENOUS

## 2019-02-14 MED ORDER — SODIUM CHLORIDE 0.9 % IV SOLN
INTRAVENOUS | Status: AC
  Filled 2019-02-14: qty 250

## 2019-02-14 MED ORDER — FUROSEMIDE 20 MG PO TABS
20.0000 mg | ORAL_TABLET | Freq: Every day | ORAL | Status: DC
  Administered 2019-02-14 – 2019-02-15 (×2): 20 mg via ORAL
  Filled 2019-02-14 (×2): qty 1

## 2019-02-14 MED ORDER — POLYETHYLENE GLYCOL 3350 17 G PO PACK: 17.0000 g | PACK | Freq: Every day | ORAL | Status: DC | PRN

## 2019-02-14 MED ORDER — INSULIN ASPART 100 UNIT/ML ~~LOC~~ SOLN
5.0000 [IU] | Freq: Once | SUBCUTANEOUS | Status: AC
  Administered 2019-02-14: 5 [IU] via SUBCUTANEOUS

## 2019-02-14 MED ORDER — ONDANSETRON HCL 4 MG/2ML IJ SOLN
INTRAMUSCULAR | Status: DC | PRN
  Administered 2019-02-14: 4 mg via INTRAVENOUS

## 2019-02-14 MED ORDER — METFORMIN HCL 500 MG PO TABS
1000.0000 mg | ORAL_TABLET | Freq: Two times a day (BID) | ORAL | Status: DC
  Administered 2019-02-15: 10:00:00 1000 mg via ORAL
  Filled 2019-02-14: qty 2

## 2019-02-14 MED ORDER — INSULIN GLARGINE 100 UNIT/ML ~~LOC~~ SOLN
20.0000 [IU] | Freq: Every day | SUBCUTANEOUS | Status: DC
  Administered 2019-02-14 – 2019-02-15 (×2): 20 [IU] via SUBCUTANEOUS
  Filled 2019-02-14 (×2): qty 0.2

## 2019-02-14 MED ORDER — INSULIN ASPART 100 UNIT/ML ~~LOC~~ SOLN
0.0000 [IU] | Freq: Three times a day (TID) | SUBCUTANEOUS | Status: DC
  Administered 2019-02-14: 19:00:00 3 [IU] via SUBCUTANEOUS
  Administered 2019-02-15: 10:00:00 11 [IU] via SUBCUTANEOUS
  Administered 2019-02-15: 13:00:00 8 [IU] via SUBCUTANEOUS

## 2019-02-14 MED ORDER — DEXAMETHASONE SODIUM PHOSPHATE 10 MG/ML IJ SOLN
INTRAMUSCULAR | Status: DC | PRN
  Administered 2019-02-14: 10 mg via INTRAVENOUS

## 2019-02-14 MED ORDER — SENNOSIDES-DOCUSATE SODIUM 8.6-50 MG PO TABS: 1.0000 | ORAL_TABLET | Freq: Every day | ORAL | Status: DC | PRN

## 2019-02-14 MED ORDER — OXYCODONE HCL 5 MG PO TABS: 5.0000 mg | ORAL_TABLET | Freq: Once | ORAL | Status: DC | PRN

## 2019-02-14 MED ORDER — ROCURONIUM BROMIDE 10 MG/ML (PF) SYRINGE
PREFILLED_SYRINGE | INTRAVENOUS | Status: DC | PRN
  Administered 2019-02-14: 10 mg via INTRAVENOUS
  Administered 2019-02-14: 20 mg via INTRAVENOUS
  Administered 2019-02-14: 70 mg via INTRAVENOUS

## 2019-02-14 MED ORDER — ASPIRIN EC 81 MG PO TBEC
81.0000 mg | DELAYED_RELEASE_TABLET | Freq: Every day | ORAL | Status: DC
  Administered 2019-02-14 – 2019-02-15 (×2): 81 mg via ORAL
  Filled 2019-02-14 (×2): qty 1

## 2019-02-14 MED ORDER — INSULIN ASPART 100 UNIT/ML ~~LOC~~ SOLN
5.0000 [IU] | Freq: Three times a day (TID) | SUBCUTANEOUS | Status: DC
  Administered 2019-02-15 (×2): 5 [IU] via SUBCUTANEOUS

## 2019-02-14 MED ORDER — DORZOLAMIDE HCL-TIMOLOL MAL 2-0.5 % OP SOLN
1.0000 [drp] | Freq: Two times a day (BID) | OPHTHALMIC | Status: DC
  Administered 2019-02-14 – 2019-02-15 (×2): 1 [drp] via OPHTHALMIC
  Filled 2019-02-14: qty 10

## 2019-02-14 MED ORDER — SODIUM CHLORIDE 0.9 % IV SOLN
INTRAVENOUS | Status: DC
  Administered 2019-02-14: 11:00:00 via INTRAVENOUS

## 2019-02-14 MED ORDER — LATANOPROST 0.005 % OP SOLN
1.0000 [drp] | Freq: Every day | OPHTHALMIC | Status: DC
  Administered 2019-02-14: 1 [drp] via OPHTHALMIC
  Filled 2019-02-14: qty 2.5

## 2019-02-14 MED ORDER — SUGAMMADEX SODIUM 500 MG/5ML IV SOLN
INTRAVENOUS | Status: DC | PRN
  Administered 2019-02-14: 300 mg via INTRAVENOUS

## 2019-02-14 MED ORDER — ONDANSETRON HCL 4 MG/2ML IJ SOLN: 4.0000 mg | Freq: Four times a day (QID) | INTRAMUSCULAR | Status: DC | PRN

## 2019-02-14 NOTE — Transfer of Care (Signed)
Immediate Anesthesia Transfer of Care Note  Patient: Kim Nunez  Procedure(s) Performed: CT WITH ANESTHESIA CRYO ABLATION AND BIOPSY (Left )  Patient Location: PACU  Anesthesia Type:General  Level of Consciousness: awake, alert  and patient cooperative  Airway & Oxygen Therapy: Patient Spontanous Breathing and Patient connected to face mask oxygen  Post-op Assessment: Report given to RN and Post -op Vital signs reviewed and stable  Post vital signs: Reviewed and stable  Last Vitals:  Vitals Value Taken Time  BP 130/78 02/14/19 1654  Temp    Pulse 71 02/14/19 1658  Resp 16 02/14/19 1658  SpO2 100 % 02/14/19 1658  Vitals shown include unvalidated device data.  Last Pain:  Vitals:   02/14/19 1000  TempSrc: Oral         Complications: No apparent anesthesia complications

## 2019-02-14 NOTE — Anesthesia Postprocedure Evaluation (Signed)
Anesthesia Post Note  Patient: Kim Nunez  Procedure(s) Performed: CT WITH ANESTHESIA CRYO ABLATION AND BIOPSY (Left )     Patient location during evaluation: PACU Anesthesia Type: General Level of consciousness: awake and alert Pain management: pain level controlled Vital Signs Assessment: post-procedure vital signs reviewed and stable Respiratory status: spontaneous breathing, nonlabored ventilation and respiratory function stable Cardiovascular status: blood pressure returned to baseline and stable Postop Assessment: no apparent nausea or vomiting Anesthetic complications: no    Last Vitals:  Vitals:   02/14/19 1715 02/14/19 1805  BP: 135/76 138/73  Pulse: 77 82  Resp: 17 17  Temp:  (!) 36.4 C  SpO2: 99% 95%    Last Pain:  Vitals:   02/14/19 1805  TempSrc: Oral  PainSc:                  Lynda Rainwater

## 2019-02-14 NOTE — Sedation Documentation (Signed)
Anesthesia in to sedate and monitor. 

## 2019-02-14 NOTE — H&P (Signed)
Referring Physician(s): [place]  Supervising Physician: Aletta Edouard  Patient Status:  [facility] OP TBA  Chief Complaint: Left renal mass   Subjective: Patient familiar to IR service from tele consult with Dr. 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  She was deemed an appropriate candidate for CT-guided biopsy and cryoablation of the mass and presents today for the procedure.  Past medical history also significant for right breast cancer, developmental delay, diabetes, hypertension, hyperparathyroidism and hyperlipidemia.  She currently resides in a rehab nursing facility.  Her sister [name] lives close by and is her power of attorney.  Patient currently denies fever, headache, chest pain, dyspnea, cough, abdominal/back pain, nausea, vomiting or bleeding.  She is COVID-19 negative.  Past Medical History:  Diagnosis Date  . Breast cancer ([place])   . Constipation   . Dementia ([place])    MILD PER FACILITY  . Developmental delay   . Diabetes mellitus type 2 with complications, uncontrolled ([place])   . Hearing loss   . Hyperlipidemia   . Hyperparathyroidism ([place])   . Hypertension    Past Surgical History:  Procedure Laterality Date  . IR RADIOLOGIST EVAL & MGMT  12/07/2018  . right breast masectomy        Allergies: Atorvastatin  Medications: Prior to Admission medications   Medication Sig Start Date End Date Taking? Authorizing Provider  acetaminophen (TYLENOL) 650 MG CR tablet Take 1,300 mg by mouth 2 (two) times daily.   Yes [provider]  aspirin EC 81 MG tablet Take 81 mg by mouth daily. 07/09/08  Yes [provider]  Cholecalciferol (VITAMIN D3) 25 MCG (1000 UT) CAPS Take 1,000 Units by mouth daily. 05/24/18 05/24/19 Yes [provider]  dorzolamide-timolol (COSOPT) 22.3-6.8 MG/ML ophthalmic solution Place 1 drop into both eyes 2 (two) times daily. 06/17/16  Yes  [provider]  furosemide (LASIX) 20 MG tablet Take 20 mg by mouth daily.   Yes [provider]  glucose 4 GM chewable tablet Chew 2 tablets by mouth as needed for low blood sugar (for blood sugar less than 70).   Yes [provider]  insulin glargine (LANTUS) 100 UNIT/ML injection Inject 0.17 mLs (17 Units total) into the skin daily. Patient taking differently: Inject 20 Units into the skin daily.  09/23/18  Yes Gouru, Aruna, MD  insulin lispro (HUMALOG) 100 UNIT/ML injection Inject 5 Units into the skin 3 (three) times daily before meals. Hold if blood sugar is less than 200   Yes [provider]  magnesium oxide (MAG-OX) 400 (241.3 Mg) MG tablet Take 400 mg by mouth 2 (two) times daily. 09/09/18  Yes [provider]  metFORMIN (GLUCOPHAGE) 500 MG tablet Take 1,000 mg by mouth 2 (two) times a day. 07/06/16  Yes [provider]  omeprazole (PRILOSEC) 20 MG capsule Take 20 mg by mouth daily. 08/11/16  Yes [provider]  ondansetron (ZOFRAN) 4 MG tablet Take 4 mg by mouth every 8 (eight) hours as needed for nausea or vomiting.   Yes [provider]  polyethylene glycol (MIRALAX / GLYCOLAX) 17 g packet Take 17 g by mouth daily as needed for mild constipation or moderate constipation.  08/15/17  Yes [provider]  simethicone (MYLICON) 80 MG chewable tablet Chew 1 tablet (80 mg total) by mouth 4 (four) times daily. 09/22/18  Yes Gouru, Aruna, MD  Travoprost, BAK Free, (TRAVATAN Z) 0.004 % SOLN ophthalmic  solution Place 1 drop into both eyes at bedtime.  02/19/16  Yes [provider]  Zinc Oxide (DESITIN) 13 % CREA Apply 1 application topically 2 (two) times daily. After toileting.   Yes [provider]  oxyCODONE (OXY IR/ROXICODONE) 5 MG immediate release tablet Take 1 tablet (5 mg total) by mouth every 6 (six) hours as needed for moderate pain. Patient not taking: Reported on 12/25/2018 09/22/18   Nicholes Mango,  MD     Vital Signs: BP 123/74   Pulse 72   Temp 98 F (36.7 C) (Oral)   Resp 16   Ht 5\' 6"  (1.676 m)   Wt 178 lb 5 oz (80.9 kg)   SpO2 100%   BMI 28.78 kg/m   Physical Exam awake, alert.  Chest clear to auscultation bilaterally.  Heart with normal rate, occasional ectopy.  Abdomen soft, positive bowel sounds, nontender.  Trace to 1+ pretibial edema bilaterally.  Imaging: Dg Chest 1 View  Result Date: 02/14/2019 CLINICAL DATA:  Preop cryoablation EXAM: CHEST  1 VIEW COMPARISON:  None. FINDINGS: Heart and mediastinal contours are within normal limits. No focal opacities or effusions. No acute bony abnormality. Surgical clips in the right chest wall. IMPRESSION: No active cardiopulmonary disease. Electronically Signed   By: Rolm Baptise M.D.   On: 02/14/2019 10:07    Labs:  CBC: Recent Labs    09/15/18 1747 09/16/18 0532 02/14/19 1018  WBC 7.5 7.2 4.9  HGB 13.0 11.8* 11.7*  HCT 40.9 35.9* 37.6  PLT 295 294 328    COAGS: No results for input(s): INR, APTT in the last 8760 hours.  BMP: Recent Labs    09/18/18 0518 09/19/18 0407 09/20/18 0425 02/14/19 1018  NA 128* 134* 136 137  K 4.3 4.7 4.3 4.3  CL 96* 102 104 104  CO2 23 22 24 25   GLUCOSE 250* 60* 78 250*  BUN 12 11 9  26*  CALCIUM 10.2 10.2 10.5* 11.1*  CREATININE 0.94 0.77 0.85 0.97  GFRNONAA >60 >60 >60 58*  GFRAA >60 >60 >60 >60    LIVER FUNCTION TESTS: Recent Labs    09/15/18 1747  BILITOT 1.5*  AST 16  ALT 19  ALKPHOS 79  PROT 7.1  ALBUMIN 4.0    Assessment and Plan: Patient with prior history of right breast cancer/mastectomy, developmental delay, diabetes, hyperlipidemia, hypertension, hyperparathyroidism and 5 cm 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 Kathlene Cote on 12/07/2018 and deemed an appropriate candidate for CT-guided biopsy and cryoablation of the renal mass.  She presents today for the procedure.  She is COVID- 19 negative.  Details/risks of procedure, including but not limited to, internal bleeding, infection, injury to adjacent structures, anesthesia related complications discussed with patient's sister and power of attorney [name] with her understanding and consent.  Post procedure the patient will be admitted to the hospital for overnight observation.   Electronically Signed: D. Rowe Robert, PA-C  02/14/2019, 10:59 AM   I spent a total of 30 minutes at the the patient's bedside AND on the patient's hospital floor or unit, greater than 50% of which was counseling/coordinating care for CT-guided biopsy and cryoablation of left renal mass

## 2019-02-14 NOTE — Anesthesia Procedure Notes (Signed)
Procedure Name: Intubation Date/Time: 02/14/2019 1:16 PM Performed by: Silas [place], CRNA Pre-anesthesia Checklist: Patient identified, Emergency Drugs available, Suction available and Patient being monitored Patient Re-evaluated:Patient Re-evaluated prior to induction Oxygen Delivery Method: Circle system utilized Preoxygenation: Pre-oxygenation with 100% oxygen Induction Type: IV induction Ventilation: Mask ventilation without difficulty Laryngoscope Size: Mac and 4 Grade View: Grade I Tube type: Oral Tube size: 7.0 mm Number of attempts: 1 Airway Equipment and Method: Stylet Placement Confirmation: ETT inserted through vocal cords under direct vision,  positive ETCO2 and breath sounds checked- equal and bilateral Secured at: 23 cm Tube secured with: Tape Dental Injury: Teeth and Oropharynx as per pre-operative assessment

## 2019-02-14 NOTE — Procedures (Signed)
Interventional Radiology Procedure Note  Procedure: CT guided biopsy and cryoablation of left renal mass  Anesthesia: General  Complications: None  Estimated Blood Loss: < 10 mL  Findings: 18 G core biopsy of 5 cm left upper pole renal mass x 1 via 17 G needle.  Hydrodissection between mass and spleen as well as between mass and colon with 180 mL of dilute contrast/NS mixture.   Cryoablation of left renal mass with 4 Galil Ice Rod CX probes.  Plan: PACU recovery followed by overnight observation.  Venetia Night. Kathlene Cote, M.D Pager:  [phone]

## 2019-02-14 NOTE — Anesthesia Preprocedure Evaluation (Addendum)
Anesthesia Evaluation  Patient identified by MRN, date of birth, ID band Patient awake    Reviewed: Allergy & Precautions, NPO status , Patient's Chart, lab work & pertinent test results  History of Anesthesia Complications Negative for: history of anesthetic complications  Airway Mallampati: II  TM Distance: >3 FB Neck ROM: Full    Dental no notable dental hx. (+) Dental Advisory Given, Edentulous Upper   Pulmonary neg pulmonary ROS,    Pulmonary exam normal breath sounds clear to auscultation       Cardiovascular hypertension (no meds), Normal cardiovascular exam Rhythm:Regular Rate:Normal     Neuro/Psych PSYCHIATRIC DISORDERS Dementia  Hearing loss     GI/Hepatic Neg liver ROS, GERD  Medicated and Controlled,  Endo/Other  diabetes, Type 2, Oral Hypoglycemic Agents, Insulin Dependent  Renal/GU negative Renal ROS     Musculoskeletal negative musculoskeletal ROS (+)   Abdominal   Peds  (+) mental retardation Hematology negative hematology ROS (+)   Anesthesia Other Findings   Reproductive/Obstetrics                           Anesthesia Physical Anesthesia Plan  ASA: III  Anesthesia Plan: General   Post-op Pain Management:    Induction: Intravenous  PONV Risk Score and Plan: 3 and Treatment may vary due to age or medical condition and Ondansetron  Airway Management Planned: Oral ETT  Additional Equipment: None  Intra-op Plan:   Post-operative Plan: Extubation in OR  Informed Consent: I have reviewed the patients History and Physical, chart, labs and discussed the procedure including the risks, benefits and alternatives for the proposed anesthesia with the patient or authorized representative who has indicated his/her understanding and acceptance.     Dental advisory given and Consent reviewed with POA  Plan Discussed with: CRNA and Anesthesiologist  Anesthesia Plan  Comments:        Anesthesia Quick Evaluation

## 2019-02-15 ENCOUNTER — Encounter (HOSPITAL_COMMUNITY): Payer: Self-pay | Admitting: Interventional Radiology

## 2019-02-15 DIAGNOSIS — C649 Malignant neoplasm of unspecified kidney, except renal pelvis: Secondary | ICD-10-CM | POA: Diagnosis not present

## 2019-02-15 LAB — GLUCOSE, CAPILLARY
Glucose-Capillary: 55 mg/dL — ABNORMAL LOW (ref 70–99)
Glucose-Capillary: 56 mg/dL — ABNORMAL LOW (ref 70–99)
Glucose-Capillary: 70 mg/dL (ref 70–99)
Glucose-Capillary: 310 mg/dL — ABNORMAL HIGH (ref 70–99)
Glucose-Capillary: 288 mg/dL — ABNORMAL HIGH (ref 70–99)

## 2019-02-15 LAB — CBC
HCT: 33.5 % — ABNORMAL LOW (ref 36.0–46.0)
Hemoglobin: 10.4 g/dL — ABNORMAL LOW (ref 12.0–15.0)
MCH: 25.9 pg — ABNORMAL LOW (ref 26.0–34.0)
MCHC: 31 g/dL (ref 30.0–36.0)
MCV: 83.3 fL (ref 80.0–100.0)
Platelets: 296 10*3/uL (ref 150–400)
RBC: 4.02 MIL/uL (ref 3.87–5.11)
RDW: 14.1 % (ref 11.5–15.5)
WBC: 8.4 10*3/uL (ref 4.0–10.5)
nRBC: 0 % (ref 0.0–0.2)

## 2019-02-15 LAB — BASIC METABOLIC PANEL
Anion gap: 8 (ref 5–15)
BUN: 23 mg/dL (ref 8–23)
CO2: 22 mmol/L (ref 22–32)
Calcium: 10.1 mg/dL (ref 8.9–10.3)
Chloride: 104 mmol/L (ref 98–111)
Creatinine, Ser: 1.01 mg/dL — ABNORMAL HIGH (ref 0.44–1.00)
GFR calc Af Amer: >60 mL/min (ref >60)
GFR calc non Af Amer: 55 mL/min — ABNORMAL LOW (ref >60)
Glucose, Bld: 330 mg/dL — ABNORMAL HIGH (ref 70–99)
Potassium: 4.3 mmol/L (ref 3.5–5.1)
Sodium: 134 mmol/L — ABNORMAL LOW (ref 135–145)

## 2019-02-15 NOTE — Progress Notes (Addendum)
Patient discharged to [facility]. Discharge instructions sent with patient.

## 2019-02-15 NOTE — Discharge Instructions (Signed)
Percutaneous Kidney Biopsy, Care After °This sheet gives you information about how to care for yourself after your procedure. Your health care provider may also give you more specific instructions. If you have problems or questions, contact your health care provider. °What can I expect after the procedure? °After the procedure, it is common to have: °· Pain or soreness near the area where the needle went through your skin (biopsy site). °· Bright pink or cloudy urine for 24 hours after the procedure. °Follow these instructions at home: °Activity °· Return to your normal activities as told by your health care provider. Ask your health care provider what activities are safe for you. °· Do not drive for 24 hours if you were given a medicine to help you relax (sedative). °· Do not lift anything that is heavier than 10 lb (4.5 kg) until your health care provider tells you that it is safe. °· Avoid activities that take a lot of effort (are strenuous) until your health care provider approves. Most people will have to wait 2 weeks before returning to activities such as exercise or sexual intercourse. °General instructions ° °· Take over-the-counter and prescription medicines only as told by your health care provider. °· You may eat and drink after your procedure. Follow instructions from your health care provider about eating or drinking restrictions. °· Check your biopsy site every day for signs of infection. Check for: °? More redness, swelling, or pain. °? More fluid or blood. °? Warmth. °? Pus or a bad smell. °· Keep all follow-up visits as told by your health care provider. This is important. °Contact a health care provider if: °· You have more redness, swelling, or pain around your biopsy site. °· You have more fluid or blood coming from your biopsy site. °· Your biopsy site feels warm to the touch. °· You have pus or a bad smell coming from your biopsy site. °· You have blood in your urine more than 24 hours after  your procedure. °Get help right away if: °· You have dark red or brown urine. °· You have a fever. °· You are unable to urinate. °· You feel burning when you urinate. °· You feel faint. °· You have severe pain in your abdomen or side. °This information is not intended to replace advice given to you by your health care provider. Make sure you discuss any questions you have with your health care provider. °Document Released: 01/03/2013 Document Revised: 04/15/2017 Document Reviewed: 02/13/2016 °Elsevier Patient Education © 2020 [employer] ° °

## 2019-02-15 NOTE — Discharge Summary (Signed)
Patient ID: Kim Nunez MRN: [id number] DOB/AGE: 11-20-1945 73 y.o.  Admit date: 02/14/2019 Discharge date: 02/15/2019  Supervising Physician: Kim Nunez  Patient Status: [facility] - In-pt  Admission Diagnoses: Left renal mass  Discharge Diagnoses: Left renal mass, status post CT-guided percutaneous core biopsy and cryoablation on 02/14/2019 via general anesthesia  Discharged Condition: good  Hospital Course: Kim Nunez is a 73 year old female with history of developmental delay, prior right breast cancer, diabetes, hypertension, hyperparathyroidism and hyperlipidemia who underwent telephone consultation (along with sister/POA Kim Nunez) with Kim Nunez on 12/07/2018 to discuss treatment options for 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  She was deemed an appropriate candidate for CT-guided biopsy and cryoablation of the mass and underwent the procedure via general anesthesia on 02/14/2019.  The procedure was performed without immediate complications and she was subsequently admitted to the hospital overnight for observation.  Overnight she did well without significant complaints except for some mild left shoulder and flank discomfort.  On the morning of discharge she was stable.  She was afebrile with BP of 108/66.  She was able to void on her own and there was no evidence of hematuria.  She tolerated her diet fine.  Follow-up laboratory studies revealed creatinine 1.01, potassium 4.3, WBC 8.4, hemoglobin 10.4, platelets 296k.  Above findings were discussed with Kim Nunez and patient was deemed stable for discharge at this time.  She will continue her current home medications.  She will follow-up with Kim Nunez as scheduled.  IR service will contact pt/patient's sister for follow-up with Kim Nunez in [place] clinic either via phone or in person in 3 to 4 weeks.  Above findings were also discussed with patient's sister and power of attorney Kim Nunez  with her understanding.  Patient will return to nursing facility([facility]) following discharge.  Consults: none  Significant Diagnostic Studies:  Results for orders placed or performed during the hospital encounter of 02/14/19  SARS Coronavirus 2 [facility] order, Performed in [place] hospital lab) Nasopharyngeal Nasopharyngeal Swab   Specimen: Nasopharyngeal Swab  Result Value Ref Range   SARS Coronavirus 2 NEGATIVE NEGATIVE  Hemoglobin A1c  Result Value Ref Range   Hgb A1c MFr Bld 9.0 (H) 4.8 - 5.6 %   Mean Plasma Glucose 211.6 mg/dL  APTT upon arrival  Result Value Ref Range   aPTT 29 24 - 36 seconds  CBC upon arrival  Result Value Ref Range   WBC 4.9 4.0 - 10.5 K/uL   RBC 4.54 3.87 - 5.11 MIL/uL   Hemoglobin 11.7 (L) 12.0 - 15.0 g/dL   HCT 37.6 36.0 - 46.0 %   MCV 82.8 80.0 - 100.0 fL   MCH 25.8 (L) 26.0 - 34.0 pg   MCHC 31.1 30.0 - 36.0 g/dL   RDW 14.3 11.5 - 15.5 %   Platelets 328 150 - 400 K/uL   nRBC 0.0 0.0 - 0.2 %  Protime-INR upon arrival  Result Value Ref Range   Prothrombin Time 11.8 11.4 - 15.2 seconds   INR 0.9 0.8 - 1.2  Basic metabolic panel  Result Value Ref Range   Sodium 137 135 - 145 mmol/L   Potassium 4.3 3.5 - 5.1 mmol/L   Chloride 104 98 - 111 mmol/L   CO2 25 22 - 32 mmol/L   Glucose, Bld 250 (H) 70 - 99 mg/dL   BUN 26 (H) 8 - 23 mg/dL   Creatinine, Ser 0.97 0.44 - 1.00 mg/dL   Calcium 11.1 (H)  8.9 - 10.3 mg/dL   GFR calc non Af Amer 58 (L) >60 mL/min   GFR calc Af Amer >60 >60 mL/min   Anion gap 8 5 - 15  Glucose, capillary  Result Value Ref Range   Glucose-Capillary 236 (H) 70 - 99 mg/dL  CBC  Result Value Ref Range   WBC 8.4 4.0 - 10.5 K/uL   RBC 4.02 3.87 - 5.11 MIL/uL   Hemoglobin 10.4 (L) 12.0 - 15.0 g/dL   HCT 33.5 (L) 36.0 - 46.0 %   MCV 83.3 80.0 - 100.0 fL   MCH 25.9 (L) 26.0 - 34.0 pg   MCHC 31.0 30.0 - 36.0 g/dL   RDW 14.1 11.5 - 15.5 %   Platelets 296 150 - 400 K/uL   nRBC 0.0 0.0 - 0.2 %  Basic metabolic  panel  Result Value Ref Range   Sodium 134 (L) 135 - 145 mmol/L   Potassium 4.3 3.5 - 5.1 mmol/L   Chloride 104 98 - 111 mmol/L   CO2 22 22 - 32 mmol/L   Glucose, Bld 330 (H) 70 - 99 mg/dL   BUN 23 8 - 23 mg/dL   Creatinine, Ser 1.01 (H) 0.44 - 1.00 mg/dL   Calcium 10.1 8.9 - 10.3 mg/dL   GFR calc non Af Amer 55 (L) >60 mL/min   GFR calc Af Amer >60 >60 mL/min   Anion gap 8 5 - 15  Glucose, capillary  Result Value Ref Range   Glucose-Capillary 174 (H) 70 - 99 mg/dL   Comment 1 Notify RN    Comment 2 Document in Chart   Glucose, capillary  Result Value Ref Range   Glucose-Capillary 292 (H) 70 - 99 mg/dL  Glucose, capillary  Result Value Ref Range   Glucose-Capillary 310 (H) 70 - 99 mg/dL   Comment 1 Notify RN    Comment 2 Document in Chart      Treatments: CT-guided percutaneous core biopsy and cryoablation of left renal mass via general anesthesia on 02/14/2019  Discharge Exam: Blood pressure 108/66, pulse 76, temperature 98.3 F (36.8 C), temperature source Oral, resp. rate 16, height 5\' 6"  (1.676 m), weight 178 lb 5 oz (80.9 kg), SpO2 98 %. Awake, conversant, hard of hearing.  Chest clear to auscultation bilaterally.  Heart with regular rate and rhythm.  Abdomen soft, positive bowel sounds, nontender.  Puncture sites left flank clean, dry, nontender, no obvious hematoma.  No significant lower extremity edema.  Disposition: Discharge disposition: 03-Skilled Nursing Facility       Discharge Instructions    Call MD for:  difficulty breathing, headache or visual disturbances   Complete by: As directed    Call MD for:  extreme fatigue   Complete by: As directed    Call MD for:  hives   Complete by: As directed    Call MD for:  persistant dizziness or light-headedness   Complete by: As directed    Call MD for:  persistant nausea and vomiting   Complete by: As directed    Call MD for:  redness, tenderness, or signs of infection (pain, swelling, redness, odor or  green/yellow discharge around incision site)   Complete by: As directed    Call MD for:  severe uncontrolled pain   Complete by: As directed    Call MD for:  temperature >100.4   Complete by: As directed    Change dressing (specify)   Complete by: As directed    May apply bandage to  puncture sites left flank for the next 2 to 3 days.  May wash sites with soap and water.   Diet - low sodium heart healthy   Complete by: As directed    Discharge instructions   Complete by: As directed    May resume home medications; stay well-hydrated   Increase activity slowly   Complete by: As directed      Allergies as of 02/15/2019      Reactions   Atorvastatin Other (See Comments)   Muscle pain Muscle pain      Medication List    TAKE these medications   acetaminophen 650 MG CR tablet Commonly known as: TYLENOL Take 650 mg by mouth 2 (two) times daily.   aspirin EC 81 MG tablet Take 81 mg by mouth daily.   Desitin 13 % Crea Generic drug: Zinc Oxide Apply 1 application topically 2 (two) times daily. After toileting.   dorzolamide-timolol 22.3-6.8 MG/ML ophthalmic solution Commonly known as: COSOPT Place 1 drop into both eyes 2 (two) times daily.   furosemide 20 MG tablet Commonly known as: LASIX Take 20 mg by mouth daily.   glucose 4 GM chewable tablet Chew 2 tablets by mouth as needed for low blood sugar (for blood sugar less than 70).   HumaLOG 100 UNIT/ML injection Generic drug: insulin lispro Inject 5 Units into the skin 3 (three) times daily before meals. Hold if blood sugar is less than 200   insulin glargine 100 UNIT/ML injection Commonly known as: LANTUS Inject 0.17 mLs (17 Units total) into the skin daily. What changed: how much to take   magnesium oxide 400 (241.3 Mg) MG tablet Commonly known as: MAG-OX Take 400 mg by mouth 2 (two) times daily.   metFORMIN 500 MG tablet Commonly known as: GLUCOPHAGE Take 1,000 mg by mouth 2 (two) times a day.   omeprazole  20 MG capsule Commonly known as: PRILOSEC Take 20 mg by mouth daily.   ondansetron 4 MG tablet Commonly known as: ZOFRAN Take 4 mg by mouth every 8 (eight) hours as needed for nausea or vomiting.   polyethylene glycol 17 g packet Commonly known as: MIRALAX / GLYCOLAX Take 17 g by mouth daily as needed for mild constipation or moderate constipation.   simethicone 80 MG chewable tablet Commonly known as: MYLICON Chew 1 tablet (80 mg total) by mouth 4 (four) times daily.   Travatan Z 0.004 % Soln ophthalmic solution Generic drug: Travoprost (BAK Free) Place 1 drop into both eyes at bedtime.   Vitamin D3 25 MCG (1000 UT) Caps Take 1,000 Units by mouth daily.            Discharge Care Instructions  (From admission, onward)         Start     Ordered   02/15/19 0000  Change dressing (specify)    Comments: May apply bandage to puncture sites left flank for the next 2 to 3 days.  May wash sites with soap and water.   02/15/19 1038         Follow-up Information    Kim Edouard, MD Follow up.   Specialties: Interventional Radiology, Radiology Why: Radiology service will contact patient for follow-up with Kim Nunez either via phone or in person at [facility] in 3 to 4 weeks.  Call [phone] or [phone] with any questions regarding recent kidney procedure. Contact information: [address] [place] [address] [phone]        Hollice Espy, MD  Follow up.   Specialty: Urology Why: Follow-up with Kim Nunez as scheduled Contact information: [place] [address] [phone]            Electronically Signed: D. Rowe Robert, PA-C 02/15/2019, 10:41 AM   I have spent Less Than 30 Minutes discharging Kim Nunez.

## 2019-02-16 LAB — SURGICAL PATHOLOGY

## 2019-02-21 ENCOUNTER — Telehealth: Payer: Self-pay | Admitting: Urology

## 2019-02-21 NOTE — Telephone Encounter (Signed)
Ebony from [facility] called and states that this pt needs a post op appt with Dr Erlene Quan but I can not find any notes on chart.  Please advise.

## 2019-02-21 NOTE — Telephone Encounter (Signed)
Patient is scheduled for a telemed call with IR on 03/07/19.  Information provided to [facility].

## 2019-03-07 ENCOUNTER — Encounter: Payer: Self-pay | Admitting: *Deleted

## 2019-03-07 ENCOUNTER — Ambulatory Visit
Admission: RE | Admit: 2019-03-07 | Discharge: 2019-03-07 | Disposition: A | Discharge status: Home / Self Care | Payer: Medicare (Managed Care) | Source: Ambulatory Visit | Attending: Radiology | Admitting: Radiology

## 2019-03-07 DIAGNOSIS — N2889 Other specified disorders of kidney and ureter: Secondary | ICD-10-CM

## 2019-03-07 HISTORY — PX: IR RADIOLOGIST EVAL & MGMT: IMG5224

## 2019-03-07 NOTE — Progress Notes (Signed)
Chief Complaint: Patient was consulted remotely today (TeleHealth) for follow-up after cryoablation of a left renal carcinoma.  History of Present Illness: Kim Nunez is a 73 y.o. female status post biopsy and cryoablation of a 5 cm left upper pole renal mass on 02/14/2019.  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  She did well following the procedure and was discharged the following morning.  She is now back at [employer].  Past Medical History:  Diagnosis Date   Breast cancer ([place])    Constipation    Dementia ([place])    MILD PER FACILITY   Developmental delay    Diabetes mellitus type 2 with complications, uncontrolled ([place])    Hearing loss    Hyperlipidemia    Hyperparathyroidism ([place])    Hypertension     Past Surgical History:  Procedure Laterality Date   IR RADIOLOGIST EVAL & MGMT  12/07/2018   RADIOLOGY WITH ANESTHESIA Left 02/14/2019   Procedure: CT WITH ANESTHESIA CRYO ABLATION AND BIOPSY;  Surgeon: Aletta Edouard, MD;  Location: [facility];  Service: Radiology;  Laterality: Left;   right breast masectomy      Allergies: Atorvastatin  Medications: Prior to Admission medications   Medication Sig Start Date End Date Taking? Authorizing Provider  acetaminophen (TYLENOL) 650 MG CR tablet Take 650 mg by mouth 2 (two) times daily.     [provider]  aspirin EC 81 MG tablet Take 81 mg by mouth daily. 07/09/08   [provider]  Cholecalciferol (VITAMIN D3) 25 MCG (1000 UT) CAPS Take 1,000 Units by mouth daily. 05/24/18 05/24/19  [provider]  dorzolamide-timolol (COSOPT) 22.3-6.8 MG/ML ophthalmic solution Place 1 drop into both eyes 2 (two) times daily. 06/17/16   [provider]  furosemide (LASIX) 20 MG tablet Take 20 mg by mouth daily.    [provider]  glucose 4 GM chewable tablet Chew 2 tablets by mouth as needed for low blood sugar (for blood sugar less than 70).     [provider]  insulin glargine (LANTUS) 100 UNIT/ML injection Inject 0.17 mLs (17 Units total) into the skin daily. Patient taking differently: Inject 20 Units into the skin daily.  09/23/18   Gouru, Illene Silver, MD  insulin lispro (HUMALOG) 100 UNIT/ML injection Inject 5 Units into the skin 3 (three) times daily before meals. Hold if blood sugar is less than 200    [provider]  magnesium oxide (MAG-OX) 400 (241.3 Mg) MG tablet Take 400 mg by mouth 2 (two) times daily. 09/09/18   [provider]  metFORMIN (GLUCOPHAGE) 500 MG tablet Take 1,000 mg by mouth 2 (two) times a day. 07/06/16   [provider]  omeprazole (PRILOSEC) 20 MG capsule Take 20 mg by mouth daily. 08/11/16   [provider]  ondansetron (ZOFRAN) 4 MG tablet Take 4 mg by mouth every 8 (eight) hours as needed for nausea or vomiting.    [provider]  polyethylene glycol (MIRALAX / GLYCOLAX) 17 g packet Take 17 g by mouth daily as needed for mild constipation or moderate constipation.  08/15/17   [provider]  simethicone (MYLICON) 80 MG chewable tablet Chew 1 tablet (80 mg total) by mouth 4 (four) times daily. 09/22/18   Gouru, Illene Silver, MD  Travoprost, BAK Free, (TRAVATAN Z) 0.004 % SOLN ophthalmic solution Place 1 drop into both eyes at bedtime.  02/19/16   [provider]  Zinc Oxide (DESITIN) 13 %  CREA Apply 1 application topically 2 (two) times daily. After toileting.    [provider]     No family history on file.  Social History   Socioeconomic History   Marital status: Single    Spouse name: Not on file   Number of children: Not on file   Years of education: Not on file   Highest education level: Not on file  Occupational History   Not on file  Social Needs   Financial resource strain: Not on file   Food insecurity    Worry: Not on file    Inability: Not on file   Transportation needs    Medical: Not on file    Non-medical: Not  on file  Tobacco Use   Smoking status: Never Smoker   Smokeless tobacco: Never Used  Substance and Sexual Activity   Alcohol use: Never    Frequency: Never   Drug use: Never   Sexual activity: Not on file  Lifestyle   Physical activity    Days per week: Not on file    Minutes per session: Not on file   Stress: Not on file  Relationships   Social connections    Talks on phone: Not on file    Gets together: Not on file    Attends religious service: Not on file    Active member of club or organization: Not on file    Attends meetings of clubs or organizations: Not on file    Relationship status: Not on file  Other Topics Concern   Not on file  Social History Narrative   Not on file    ECOG Status: 0 - Asymptomatic  Review of Systems  Review of Systems: A 12 point ROS discussed and pertinent positives are indicated in the HPI above.  All other systems are negative.  Physical Exam No direct physical exam was performed (except for noted visual exam findings with Video Visits).    Vital Signs: There were no vitals taken for this visit.  Imaging: Dg Chest 1 View  Result Date: 02/14/2019 CLINICAL DATA:  Preop cryoablation EXAM: CHEST  1 VIEW COMPARISON:  None. FINDINGS: Heart and mediastinal contours are within normal limits. No focal opacities or effusions. No acute bony abnormality. Surgical clips in the right chest wall. IMPRESSION: No active cardiopulmonary disease. Electronically Signed   By: Rolm Baptise M.D.   On: 02/14/2019 10:07   Ct Guide Tissue Ablation  Result Date: 02/14/2019 CLINICAL DATA:  5 cm left upper pole renal mass. The patient presents for biopsy and cryoablation of the mass. EXAM: CT-GUIDED CORE BIOPSY OF LEFT RENAL MASS CT-GUIDED PERCUTANEOUS CRYOABLATION OF LEFT RENAL MASS ANESTHESIA/SEDATION: General MEDICATIONS: 2 g IV Ancef. The antibiotic was administered in an appropriate time interval prior to needle puncture of the skin. CONTRAST:  None  PROCEDURE: The procedure, risks, benefits, and alternatives were explained to the patient and her POA. Questions regarding the procedure were encouraged and answered. The patient's POA understands and consents to the procedure. The patient was placed under general anesthesia. Initial unenhanced CT was performed in a prone position to localize the left renal mass. A time-out was performed prior to initiating the procedure. The left flank region was prepped with chlorhexidine in a sterile fashion, and a sterile drape was applied covering the operative field. A sterile gown and sterile gloves were used for the procedure. A 17 gauge trocar needle was advanced into the left renal mass. Core biopsy was performed with an  18 gauge automated core biopsy device. A single core sample was submitted in formalin for pathologic analysis. Under CT guidance, four total Galil Ice Rod CX percutaneous cryoablation probes were advanced into the left renal mass. Probe positioning was confirmed by CT prior to cryoablation. Prior to ablation, hydrodissection was performed between the mass and the lower spleen as well as the mass and splenic flexure of the colon via [place] needles with injection of a total of approximately 180 mL of a dilute contrast and saline mixture. Cryoablation was performed through the four probes simultaneously. Initial 10 minute cycle of cryoablation was performed. This was followed by a 8 minute thaw cycle. A second 10 minute cycle of cryoablation was then performed. During ablation, periodic CT imaging was performed to monitor ice ball formation and morphology. After active thaw and cautery cycle, the cryoablation probes were removed. COMPLICATIONS: None FINDINGS: Posterior upper pole mass of the left kidney measures approximately 5.1 cm in greatest diameter. Solid tissue was obtained with biopsy. During cryoablation, CT monitoring demonstrates ice ball formation that appears to encompass the lesion with  likely a close margin along the more lateral aspect of the mass. IMPRESSION: CT guided percutaneous core biopsy and cryoablation of left renal mass. The patient will be observed overnight. Electronically Signed   By: Aletta Edouard M.D.   On: 02/14/2019 17:37   Ct Biopsy  Result Date: 02/14/2019 CLINICAL DATA:  5 cm left upper pole renal mass. The patient presents for biopsy and cryoablation of the mass. EXAM: CT-GUIDED CORE BIOPSY OF LEFT RENAL MASS CT-GUIDED PERCUTANEOUS CRYOABLATION OF LEFT RENAL MASS ANESTHESIA/SEDATION: General MEDICATIONS: 2 g IV Ancef. The antibiotic was administered in an appropriate time interval prior to needle puncture of the skin. CONTRAST:  None PROCEDURE: The procedure, risks, benefits, and alternatives were explained to the patient and her POA. Questions regarding the procedure were encouraged and answered. The patient's POA understands and consents to the procedure. The patient was placed under general anesthesia. Initial unenhanced CT was performed in a prone position to localize the left renal mass. A time-out was performed prior to initiating the procedure. The left flank region was prepped with chlorhexidine in a sterile fashion, and a sterile drape was applied covering the operative field. A sterile gown and sterile gloves were used for the procedure. A 17 gauge trocar needle was advanced into the left renal mass. Core biopsy was performed with an 18 gauge automated core biopsy device. A single core sample was submitted in formalin for pathologic analysis. Under CT guidance, four total Galil Ice Rod CX percutaneous cryoablation probes were advanced into the left renal mass. Probe positioning was confirmed by CT prior to cryoablation. Prior to ablation, hydrodissection was performed between the mass and the lower spleen as well as the mass and splenic flexure of the colon via [place] needles with injection of a total of approximately 180 mL of a dilute contrast and  saline mixture. Cryoablation was performed through the four probes simultaneously. Initial 10 minute cycle of cryoablation was performed. This was followed by a 8 minute thaw cycle. A second 10 minute cycle of cryoablation was then performed. During ablation, periodic CT imaging was performed to monitor ice ball formation and morphology. After active thaw and cautery cycle, the cryoablation probes were removed. COMPLICATIONS: None FINDINGS: Posterior upper pole mass of the left kidney measures approximately 5.1 cm in greatest diameter. Solid tissue was obtained with biopsy. During cryoablation, CT monitoring demonstrates ice ball formation  that appears to encompass the lesion with likely a close margin along the more lateral aspect of the mass. IMPRESSION: CT guided percutaneous core biopsy and cryoablation of left renal mass. The patient will be observed overnight. Electronically Signed   By: Aletta Edouard M.D.   On: 02/14/2019 17:37    Labs:  CBC: Recent Labs    09/15/18 1747 09/16/18 0532 02/14/19 1018 02/15/19 0516  WBC 7.5 7.2 4.9 8.4  HGB 13.0 11.8* 11.7* 10.4*  HCT 40.9 35.9* 37.6 33.5*  PLT 295 294 328 296    COAGS: Recent Labs    02/14/19 1018  INR 0.9  APTT 29    BMP: Recent Labs    09/19/18 0407 09/20/18 0425 02/14/19 1018 02/15/19 0516  NA 134* 136 137 134*  K 4.7 4.3 4.3 4.3  CL 102 104 104 104  CO2 22 24 25 22   GLUCOSE 60* 78 250* 330*  BUN 11 9 26* 23  CALCIUM 10.2 10.5* 11.1* 10.1  CREATININE 0.77 0.85 0.97 1.01*  GFRNONAA >60 >60 58* 55*  GFRAA >60 >60 >60 >60    LIVER FUNCTION TESTS: Recent Labs    09/15/18 1747  BILITOT 1.5*  AST 16  ALT 19  ALKPHOS 79  PROT 7.1  ALBUMIN 4.0    Assessment and Plan:  I called [facility] and spoke to Kim Nunez by phone.  She had difficulty hearing our conversation and I was only able to have a limited conversation with her.  She did not have any significant complaints at this time.  I recommended that we  recheck her renal function in approximately 2 months and plan to obtain a follow-up CT of the abdomen in approximately 2 months.   Electronically Signed: Azzie Roup 03/07/2019, 3:39 PM     I spent a total of 10 Minutes in remote  clinical consultation, greater than 50% of which was counseling/coordinating care post cryoablation of a left renal carcinoma.    Visit type: Audio only (telephone). Audio (no video) only due to patient's lack of internet/smartphone capability. Alternative for in-person consultation at [facility], [address], [place], [place]. This visit type was conducted due to national recommendations for restrictions regarding the COVID-19 Pandemic (e.g. social distancing).  This format is felt to be most appropriate for this patient at this time.  All issues noted in this document were discussed and addressed.

## 2019-03-15 ENCOUNTER — Telehealth: Payer: Self-pay | Admitting: Urology

## 2019-03-15 NOTE — Telephone Encounter (Signed)
Pt's NP called and asked for a returning call about pt's cyroablation for renal mass.

## 2019-03-16 NOTE — Telephone Encounter (Signed)
Informed Keetah-verbalized understanding

## 2019-03-16 NOTE — Telephone Encounter (Signed)
Spoke with Keetah-her NP at [facility] patient's care moving forward. Patient has been treated by Dr. Kathlene Cote. Wants to know if she needs to follow back up with you ? Has a two month follow up with Dr. Kathlene Cote.

## 2019-03-16 NOTE — Telephone Encounter (Signed)
No, she just needs to follow-up with Dr. Kathlene Cote.  Seeing both of [place] is redundant.  Hollice Espy, MD

## 2019-03-27 ENCOUNTER — Other Ambulatory Visit: Payer: Self-pay | Admitting: Interventional Radiology

## 2019-03-27 DIAGNOSIS — N2889 Other specified disorders of kidney and ureter: Secondary | ICD-10-CM

## 2019-03-27 DIAGNOSIS — C649 Malignant neoplasm of unspecified kidney, except renal pelvis: Secondary | ICD-10-CM

## 2019-04-13 ENCOUNTER — Ambulatory Visit: Admission: RE | Admit: 2019-04-13 | Payer: Medicare (Managed Care) | Source: Ambulatory Visit

## 2019-04-17 ENCOUNTER — Telehealth: Payer: Medicare (Managed Care)

## 2019-05-01 ENCOUNTER — Ambulatory Visit: Admission: RE | Admit: 2019-05-01 | Payer: Medicare (Managed Care) | Source: Ambulatory Visit

## 2019-05-01 ENCOUNTER — Telehealth: Payer: Self-pay | Admitting: *Deleted

## 2019-05-01 ENCOUNTER — Other Ambulatory Visit: Payer: Self-pay | Admitting: Family

## 2019-05-01 DIAGNOSIS — N2889 Other specified disorders of kidney and ureter: Secondary | ICD-10-CM

## 2019-05-01 DIAGNOSIS — C649 Malignant neoplasm of unspecified kidney, except renal pelvis: Secondary | ICD-10-CM

## 2019-05-01 NOTE — Telephone Encounter (Signed)
I called [facility] because Ms. Krummen has been a "no show" for her past 2 Ct scans. I s/w Ebony she states the primary care physician has not signed an order for the Ct and doesn't feel she needs Ct at this time. I told her it was for follow up with Dr. Kathlene Cote from procedure and we can order the scan under his name. She said "no" it can only be ordered under primarycare.

## 2019-05-02 ENCOUNTER — Telehealth: Payer: Medicare (Managed Care)

## 2019-05-23 ENCOUNTER — Ambulatory Visit
Admission: RE | Admit: 2019-05-23 | Discharge: 2019-05-23 | Disposition: A | Discharge status: Home / Self Care | Payer: Medicare (Managed Care) | Source: Ambulatory Visit | Attending: Interventional Radiology | Admitting: Interventional Radiology

## 2019-05-23 ENCOUNTER — Other Ambulatory Visit: Payer: Self-pay

## 2019-05-23 DIAGNOSIS — N2889 Other specified disorders of kidney and ureter: Secondary | ICD-10-CM | POA: Diagnosis present

## 2019-05-23 DIAGNOSIS — C649 Malignant neoplasm of unspecified kidney, except renal pelvis: Secondary | ICD-10-CM | POA: Insufficient documentation

## 2019-05-23 LAB — POCT I-STAT CREATININE: Creatinine, Ser: 0.9 mg/dL (ref 0.44–1.00)

## 2019-05-23 MED ORDER — IOHEXOL 300 MG/ML  SOLN
100.0000 mL | Freq: Once | INTRAMUSCULAR | Status: AC | PRN
  Administered 2019-05-23: 100 mL via INTRAVENOUS

## 2019-05-24 ENCOUNTER — Encounter: Payer: Self-pay | Admitting: *Deleted

## 2019-05-24 ENCOUNTER — Ambulatory Visit
Admission: RE | Admit: 2019-05-24 | Discharge: 2019-05-24 | Disposition: A | Discharge status: Home / Self Care | Payer: Medicare (Managed Care) | Source: Ambulatory Visit | Attending: Interventional Radiology | Admitting: Interventional Radiology

## 2019-05-24 DIAGNOSIS — N2889 Other specified disorders of kidney and ureter: Secondary | ICD-10-CM

## 2019-05-24 DIAGNOSIS — C649 Malignant neoplasm of unspecified kidney, except renal pelvis: Secondary | ICD-10-CM

## 2019-05-24 HISTORY — PX: IR RADIOLOGIST EVAL & MGMT: IMG5224

## 2019-05-24 NOTE — Progress Notes (Signed)
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  History of Present Illness: Kim Nunez is a 74 y.o. 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  She tolerated the procedure well without complication.  Renal function has been stable and normal after the procedure.  She has no significant complaints today.  Past Medical History:  Diagnosis Date  . Breast cancer ([place])   . Constipation   . Dementia ([place])    MILD PER FACILITY  . Developmental delay   . Diabetes mellitus type 2 with complications, uncontrolled ([place])   . Hearing loss   . Hyperlipidemia   . Hyperparathyroidism ([place])   . Hypertension     Past Surgical History:  Procedure Laterality Date  . IR RADIOLOGIST EVAL & MGMT  12/07/2018  . IR RADIOLOGIST EVAL & MGMT  03/07/2019  . RADIOLOGY WITH ANESTHESIA Left 02/14/2019   Procedure: CT WITH ANESTHESIA CRYO ABLATION AND BIOPSY;  Surgeon: Aletta Edouard, MD;  Location: [facility];  Service: Radiology;  Laterality: Left;  . right breast masectomy      Allergies: Atorvastatin  Medications: Prior to Admission medications   Medication Sig Start Date End Date Taking? Authorizing Provider  acetaminophen (TYLENOL) 650 MG CR tablet Take 650 mg by mouth 2 (two) times daily.     [provider]  aspirin EC 81 MG tablet Take 81 mg by mouth daily. 07/09/08   [provider]  Cholecalciferol (VITAMIN D3) 25 MCG (1000 UT) CAPS Take 1,000 Units by mouth daily. 05/24/18 05/24/19  [provider]  dorzolamide-timolol (COSOPT) 22.3-6.8 MG/ML ophthalmic solution Place 1 drop into both eyes 2 (two) times daily. 06/17/16   [provider]  furosemide (LASIX) 20 MG tablet Take 20 mg by mouth daily.    [provider]  glucose 4 GM chewable tablet Chew 2 tablets by mouth as needed for low blood sugar (for blood  sugar less than 70).    [provider]  insulin glargine (LANTUS) 100 UNIT/ML injection Inject 0.17 mLs (17 Units total) into the skin daily. Patient taking differently: Inject 20 Units into the skin daily.  09/23/18   Gouru, Illene Silver, MD  insulin lispro (HUMALOG) 100 UNIT/ML injection Inject 5 Units into the skin 3 (three) times daily before meals. Hold if blood sugar is less than 200    [provider]  magnesium oxide (MAG-OX) 400 (241.3 Mg) MG tablet Take 400 mg by mouth 2 (two) times daily. 09/09/18   [provider]  metFORMIN (GLUCOPHAGE) 500 MG tablet Take 1,000 mg by mouth 2 (two) times a day. 07/06/16   [provider]  omeprazole (PRILOSEC) 20 MG capsule Take 20 mg by mouth daily. 08/11/16   [provider]  ondansetron (ZOFRAN) 4 MG tablet Take 4 mg by mouth every 8 (eight) hours as needed for nausea or vomiting.    [provider]  polyethylene glycol (MIRALAX / GLYCOLAX) 17 g packet Take 17 g by mouth daily as needed for mild constipation or moderate constipation.  08/15/17   [provider]  simethicone (MYLICON) 80 MG chewable tablet Chew 1 tablet (80 mg total) by mouth 4 (four) times daily. 09/22/18   Gouru, Illene Silver, MD  Travoprost, BAK Free, (TRAVATAN Z) 0.004 % SOLN ophthalmic solution Place 1 drop into both eyes at bedtime.  02/19/16   [provider]  Zinc Oxide (DESITIN)  13 % CREA Apply 1 application topically 2 (two) times daily. After toileting.    [provider]     No family history on file.  Social History   Socioeconomic History  . Marital status: Single    Spouse name: Not on file  . Number of children: Not on file  . Years of education: Not on file  . Highest education level: Not on file  Occupational History  . Not on file  Tobacco Use  . Smoking status: Never Smoker  . Smokeless tobacco: Never Used  Substance and Sexual Activity  . Alcohol use: Never  . Drug use: Never  . Sexual  activity: Not on file  Other Topics Concern  . Not on file  Social History Narrative  . Not on file   Social Determinants of Health   Financial Resource Strain:   . Difficulty of Paying Living Expenses: Not on file  Food Insecurity:   . Worried About Charity fundraiser in the Last Year: Not on file  . Ran Out of Food in the Last Year: Not on file  Transportation Needs:   . Lack of Transportation (Medical): Not on file  . Lack of Transportation (Non-Medical): Not on file  Physical Activity:   . Days of Exercise per Week: Not on file  . Minutes of Exercise per Session: Not on file  Stress:   . Feeling of Stress : Not on file  Social Connections:   . Frequency of Communication with Friends and Family: Not on file  . Frequency of Social Gatherings with Friends and Family: Not on file  . Attends Religious Services: Not on file  . Active Member of Clubs or Organizations: Not on file  . Attends Archivist Meetings: Not on file  . Marital Status: Not on file    ECOG Status: 0 - Asymptomatic  Review of Systems  Constitutional: Negative.   Respiratory: Negative.   Cardiovascular: Negative.   Gastrointestinal: Negative.   Genitourinary: Negative.   Musculoskeletal:       Left shoulder pain.  Neurological: Negative.     Review of Systems: A 12 point ROS discussed and pertinent positives are indicated in the HPI above.  All other systems are negative.  Physical Exam No direct physical exam was performed (except for noted visual exam findings with Video Visits).   Vital Signs: There were no vitals taken for this visit.  Imaging: CT ABDOMEN W WO CONTRAST  Result Date: 05/23/2019 CLINICAL DATA:  Status post left renal cryoablation and biopsy EXAM: CT ABDOMEN WITHOUT AND WITH CONTRAST TECHNIQUE: Multidetector CT imaging of the abdomen was performed following the standard protocol before and following the bolus administration of intravenous contrast. CONTRAST:  141mL  OMNIPAQUE IOHEXOL 300 MG/ML  SOLN COMPARISON:  09/17/2018 FINDINGS: Lower chest: Tiny pulmonary nodule in the right lung base (image 13, series 3) approximately 4 mm is unchanged compared to prior study. Hepatobiliary: Gallbladder is distended and has been shown to be distended on prior studies with similar appearance. No signs of biliary ductal dilation. Pancreas: Pancreas is atrophic as before. Spleen: Spleen with low-density foci, not definitely seen on prior studies though suggested on the MRI evaluation of 09/20/2018. The first phase of enhancement on that study is the closest to the late arterial phase on the current exam. Adrenals/Urinary Tract: Adrenal glands are normal. Signs of renal cortical scarring. Left renal mass post cryoablation measuring 4.7 x 4.1 cm, thicker peripheral rim on the current study than  on the prior, size on the previous exam 4.6 x 3.9 cm. Thick peripheral rim is dense prior to contrast administration perhaps some delayed enhancement along the rim of this lesion on later phase. No signs of nodular enhancement. Central calcifications are demonstrated. No overt internal enhancement. No additional suspicious renal lesion. Stomach/Bowel: Moderate colonic distension, gas and stool filled with unchanged appearance. Appendix is normal. Bowel loops are incompletely imaged. Vascular/Lymphatic: Calcified atherosclerotic plaque and noncalcified plaque mild in the abdominal aorta without signs of aneurysm. Renal veins are patent. No signs of retroperitoneal or renal hilar lymphadenopathy. Other: Mild body wall edema. No signs of ascites. Musculoskeletal: Signs of right mastectomy. No acute bone finding or destructive bone process. IMPRESSION: Post left renal cryoablation with thicker appearing rim surrounding the lesion on today's study than on the previous likely related to treatment changes. MRI follow-up could potentially be helpful if possible for tracking for any changes in this lesion.  Gallbladder distension is similar to prior studies. Tiny low-density foci in the spleen less apparent on delayed phase imaging perhaps present previously and not as well seen due to differences in phase of contrast enhancement. Perhaps benign splenic lesions such as small splenic hemangiomata the given history of breast cancer and renal cell carcinoma consider shorter interval follow-up for this finding at 3 months. Spleen is mildly larger than on the prior study as well. Also correlate with any history of sarcoidosis which could have this appearance. Colonic distension as before. Aortic Atherosclerosis (ICD10-I70.0). Electronically Signed   By: Zetta Bills M.D.   On: 05/23/2019 13:23    Labs:  CBC: Recent Labs    09/15/18 1747 09/16/18 0532 02/14/19 1018 02/15/19 0516  WBC 7.5 7.2 4.9 8.4  HGB 13.0 11.8* 11.7* 10.4*  HCT 40.9 35.9* 37.6 33.5*  PLT 295 294 328 296    COAGS: Recent Labs    02/14/19 1018  INR 0.9  APTT 29    BMP: Recent Labs    09/19/18 0407 09/20/18 0425 02/14/19 1018 02/15/19 0516 05/23/19 1128  NA 134* 136 137 134*  --   K 4.7 4.3 4.3 4.3  --   CL 102 104 104 104  --   CO2 22 24 25 22   --   GLUCOSE 60* 78 250* 330*  --   BUN 11 9 26* 23  --   CALCIUM 10.2 10.5* 11.1* 10.1  --   CREATININE 0.77 0.85 0.97 1.01* 0.90  GFRNONAA >60 >60 58* 55*  --   GFRAA >60 >60 >60 >60  --     LIVER FUNCTION TESTS: Recent Labs    09/15/18 1747  BILITOT 1.5*  AST 16  ALT 19  ALKPHOS 79  PROT 7.1  ALBUMIN 4.0    Assessment and Plan:  I spoke with Kim Nunez over the phone as well as separately with Kim Nunez.  I reviewed follow-up CT findings with them which show an initial adequate ablation defect at the level of the treated carcinoma and no evidence of significant enhancement within ablated tissue.  There is some nonspecific low-density foci in the spleen.  I recommended another follow-up CT in late September of this year, 1 year after  ablation. This plan was also communicated to Deneise Lever, NP with [facility].   Electronically Signed: Azzie Roup 05/24/2019, 10:32 AM     I spent a total of 10 Minutes in remote  clinical consultation, greater than 50% of which was counseling/coordinating care post cryoablation of a left renal  carcinoma.    Visit type: Audio only (telephone). Audio (no video) only due to patient's lack of internet/smartphone capability. Alternative for in-person consultation at [facility], [address] [place], [place], [place]. This visit type was conducted due to national recommendations for restrictions regarding the COVID-19 Pandemic (e.g. social distancing).  This format is felt to be most appropriate for this patient at this time.  All issues noted in this document were discussed and addressed.

## 2019-08-19 ENCOUNTER — Other Ambulatory Visit: Payer: Self-pay

## 2019-08-19 ENCOUNTER — Emergency Department: Payer: Medicare (Managed Care)

## 2019-08-19 ENCOUNTER — Encounter (HOSPITAL_COMMUNITY): Payer: Self-pay | Admitting: Emergency Medicine

## 2019-08-19 ENCOUNTER — Observation Stay (HOSPITAL_COMMUNITY): Payer: Medicare (Managed Care)

## 2019-08-19 ENCOUNTER — Emergency Department
Admission: EM | Admit: 2019-08-19 | Discharge: 2019-08-19 | Payer: Medicare (Managed Care) | Attending: Emergency Medicine | Admitting: Emergency Medicine

## 2019-08-19 ENCOUNTER — Encounter: Payer: Self-pay | Admitting: Radiology

## 2019-08-19 ENCOUNTER — Observation Stay (HOSPITAL_COMMUNITY)
Admission: EM | Admit: 2019-08-19 | Discharge: 2019-09-15 | Disposition: E | Payer: Medicare (Managed Care) | Source: Other Acute Inpatient Hospital | Attending: Emergency Medicine | Admitting: Emergency Medicine

## 2019-08-19 DIAGNOSIS — A419 Sepsis, unspecified organism: Secondary | ICD-10-CM | POA: Diagnosis not present

## 2019-08-19 DIAGNOSIS — I10 Essential (primary) hypertension: Secondary | ICD-10-CM | POA: Diagnosis not present

## 2019-08-19 DIAGNOSIS — Z7982 Long term (current) use of aspirin: Secondary | ICD-10-CM | POA: Insufficient documentation

## 2019-08-19 DIAGNOSIS — R140 Abdominal distension (gaseous): Secondary | ICD-10-CM | POA: Diagnosis not present

## 2019-08-19 DIAGNOSIS — E119 Type 2 diabetes mellitus without complications: Secondary | ICD-10-CM | POA: Insufficient documentation

## 2019-08-19 DIAGNOSIS — R1110 Vomiting, unspecified: Secondary | ICD-10-CM | POA: Diagnosis present

## 2019-08-19 DIAGNOSIS — R6520 Severe sepsis without septic shock: Secondary | ICD-10-CM | POA: Diagnosis not present

## 2019-08-19 DIAGNOSIS — R1084 Generalized abdominal pain: Secondary | ICD-10-CM | POA: Diagnosis not present

## 2019-08-19 DIAGNOSIS — Z888 Allergy status to other drugs, medicaments and biological substances status: Secondary | ICD-10-CM | POA: Diagnosis not present

## 2019-08-19 DIAGNOSIS — E872 Acidosis, unspecified: Secondary | ICD-10-CM

## 2019-08-19 DIAGNOSIS — N390 Urinary tract infection, site not specified: Secondary | ICD-10-CM | POA: Diagnosis not present

## 2019-08-19 DIAGNOSIS — Z66 Do not resuscitate: Secondary | ICD-10-CM | POA: Insufficient documentation

## 2019-08-19 DIAGNOSIS — Z20822 Contact with and (suspected) exposure to covid-19: Secondary | ICD-10-CM | POA: Diagnosis not present

## 2019-08-19 DIAGNOSIS — E213 Hyperparathyroidism, unspecified: Secondary | ICD-10-CM | POA: Insufficient documentation

## 2019-08-19 DIAGNOSIS — Z79899 Other long term (current) drug therapy: Secondary | ICD-10-CM | POA: Diagnosis not present

## 2019-08-19 DIAGNOSIS — F0390 Unspecified dementia without behavioral disturbance: Secondary | ICD-10-CM | POA: Insufficient documentation

## 2019-08-19 DIAGNOSIS — K5641 Fecal impaction: Secondary | ICD-10-CM | POA: Insufficient documentation

## 2019-08-19 DIAGNOSIS — Z853 Personal history of malignant neoplasm of breast: Secondary | ICD-10-CM | POA: Insufficient documentation

## 2019-08-19 DIAGNOSIS — R000 Tachycardia, unspecified: Secondary | ICD-10-CM | POA: Diagnosis not present

## 2019-08-19 DIAGNOSIS — I469 Cardiac arrest, cause unspecified: Secondary | ICD-10-CM | POA: Diagnosis not present

## 2019-08-19 DIAGNOSIS — R339 Retention of urine, unspecified: Secondary | ICD-10-CM | POA: Insufficient documentation

## 2019-08-19 DIAGNOSIS — Z794 Long term (current) use of insulin: Secondary | ICD-10-CM | POA: Insufficient documentation

## 2019-08-19 LAB — COMPREHENSIVE METABOLIC PANEL
ALT: 20 U/L (ref 0–44)
AST: 31 U/L (ref 15–41)
Albumin: 3.9 g/dL (ref 3.5–5.0)
Alkaline Phosphatase: 87 U/L (ref 38–126)
Anion gap: 16 — ABNORMAL HIGH (ref 5–15)
BUN: 29 mg/dL — ABNORMAL HIGH (ref 8–23)
CO2: 17 mmol/L — ABNORMAL LOW (ref 22–32)
Calcium: 10.8 mg/dL — ABNORMAL HIGH (ref 8.9–10.3)
Chloride: 107 mmol/L (ref 98–111)
Creatinine, Ser: 1.4 mg/dL — ABNORMAL HIGH (ref 0.44–1.00)
GFR calc Af Amer: 43 mL/min — ABNORMAL LOW (ref >60)
GFR calc non Af Amer: 37 mL/min — ABNORMAL LOW (ref >60)
Glucose, Bld: 281 mg/dL — ABNORMAL HIGH (ref 70–99)
Potassium: 4.6 mmol/L (ref 3.5–5.1)
Sodium: 140 mmol/L (ref 135–145)
Total Bilirubin: 1.9 mg/dL — ABNORMAL HIGH (ref 0.3–1.2)
Total Protein: 7.3 g/dL (ref 6.5–8.1)

## 2019-08-19 LAB — URINALYSIS, COMPLETE (UACMP) WITH MICROSCOPIC
Bilirubin Urine: NEGATIVE
Glucose, UA: 50 mg/dL — AB
Ketones, ur: NEGATIVE mg/dL
Nitrite: NEGATIVE
Protein, ur: 30 mg/dL — AB
Specific Gravity, Urine: 1.01 (ref 1.005–1.030)
WBC, UA: >50 WBC/hpf — ABNORMAL HIGH (ref 0–5)
pH: 5 (ref 5.0–8.0)

## 2019-08-19 LAB — RESPIRATORY PANEL BY RT PCR (FLU A&B, COVID)
Influenza A by PCR: NEGATIVE
Influenza B by PCR: NEGATIVE
SARS Coronavirus 2 by RT PCR: NEGATIVE

## 2019-08-19 LAB — TYPE AND SCREEN
ABO/RH(D): O POS
Antibody Screen: NEGATIVE

## 2019-08-19 LAB — CBC WITH DIFFERENTIAL/PLATELET
Abs Immature Granulocytes: 0.19 10*3/uL — ABNORMAL HIGH (ref 0.00–0.07)
Basophils Absolute: 0.1 10*3/uL (ref 0.0–0.1)
Basophils Relative: 0 %
Eosinophils Absolute: 0 10*3/uL (ref 0.0–0.5)
Eosinophils Relative: 0 %
HCT: 42.3 % (ref 36.0–46.0)
Hemoglobin: 13 g/dL (ref 12.0–15.0)
Immature Granulocytes: 1 %
Lymphocytes Relative: 7 %
Lymphs Abs: 1.8 10*3/uL (ref 0.7–4.0)
MCH: 25.7 pg — ABNORMAL LOW (ref 26.0–34.0)
MCHC: 30.7 g/dL (ref 30.0–36.0)
MCV: 83.8 fL (ref 80.0–100.0)
Monocytes Absolute: 1.9 10*3/uL — ABNORMAL HIGH (ref 0.1–1.0)
Monocytes Relative: 8 %
Neutro Abs: 21.6 10*3/uL — ABNORMAL HIGH (ref 1.7–7.7)
Neutrophils Relative %: 84 %
Platelets: 449 10*3/uL — ABNORMAL HIGH (ref 150–400)
RBC: 5.05 MIL/uL (ref 3.87–5.11)
RDW: 15.6 % — ABNORMAL HIGH (ref 11.5–15.5)
Smear Review: NORMAL
WBC: 25.6 10*3/uL — ABNORMAL HIGH (ref 4.0–10.5)
nRBC: 0 % (ref 0.0–0.2)

## 2019-08-19 LAB — LIPASE, BLOOD: Lipase: 22 U/L (ref 11–51)

## 2019-08-19 LAB — LACTIC ACID, PLASMA
Lactic Acid, Venous: >11 mmol/L (ref 0.5–1.9)
Lactic Acid, Venous: 1 mmol/L (ref 0.5–1.9)
Lactic Acid, Venous: >11 mmol/L (ref 0.5–1.9)
Lactic Acid, Venous: 4.6 mmol/L (ref 0.5–1.9)

## 2019-08-19 LAB — PROTIME-INR
INR: 1.1 (ref 0.8–1.2)
Prothrombin Time: 14.4 seconds (ref 11.4–15.2)

## 2019-08-19 MED ORDER — STERILE WATER FOR INJECTION IV SOLN
INTRAVENOUS | Status: DC
  Filled 2019-08-19: qty 850

## 2019-08-19 MED ORDER — PROPOFOL 1000 MG/100ML IV EMUL
INTRAVENOUS | Status: AC
  Filled 2019-08-19: qty 100

## 2019-08-19 MED ORDER — SODIUM CHLORIDE 0.9 % IV SOLN
2.0000 g | Freq: Once | INTRAVENOUS | Status: AC
  Administered 2019-08-19: 2 g via INTRAVENOUS
  Filled 2019-08-19: qty 20

## 2019-08-19 MED ORDER — SODIUM BICARBONATE 8.4 % IV SOLN
INTRAVENOUS | Status: AC
  Filled 2019-08-19: qty 50

## 2019-08-19 MED ORDER — IOHEXOL 300 MG/ML  SOLN: 75.0000 mL | Freq: Once | INTRAMUSCULAR | Status: DC | PRN

## 2019-08-19 MED ORDER — PHENYLEPHRINE 40 MCG/ML (10ML) SYRINGE FOR IV PUSH (FOR BLOOD PRESSURE SUPPORT)
PREFILLED_SYRINGE | INTRAVENOUS | Status: AC
  Filled 2019-08-19: qty 10

## 2019-08-19 MED ORDER — SODIUM CHLORIDE 0.9 % IV BOLUS
1000.0000 mL | Freq: Once | INTRAVENOUS | Status: AC
  Administered 2019-08-19: 1000 mL via INTRAVENOUS

## 2019-08-19 MED ORDER — EPINEPHRINE 1 MG/10ML IJ SOSY
PREFILLED_SYRINGE | INTRAMUSCULAR | Status: AC | PRN
  Administered 2019-08-19: 1 mg via INTRAVENOUS

## 2019-08-19 MED ORDER — SODIUM CHLORIDE 0.9 % IV BOLUS
500.0000 mL | Freq: Once | INTRAVENOUS | Status: AC
  Administered 2019-08-19: 500 mL via INTRAVENOUS

## 2019-08-19 MED ORDER — NOREPINEPHRINE 4 MG/250ML-% IV SOLN
INTRAVENOUS | Status: AC
  Filled 2019-08-19: qty 250

## 2019-08-19 MED ORDER — METRONIDAZOLE IN NACL 5-0.79 MG/ML-% IV SOLN
500.0000 mg | Freq: Once | INTRAVENOUS | Status: AC
  Administered 2019-08-19: 500 mg via INTRAVENOUS
  Filled 2019-08-19: qty 100

## 2019-08-19 MED ORDER — NOREPINEPHRINE BITARTRATE 1 MG/ML IV SOLN
INTRAVENOUS | Status: AC | PRN
  Administered 2019-08-19: 22:00:00 10 ug/kg/min via INTRAVENOUS

## 2019-08-19 MED ORDER — VASOPRESSIN 20 UNIT/ML IV SOLN
0.0300 [IU]/min | INTRAVENOUS | Status: DC
  Filled 2019-08-19: qty 2

## 2019-08-19 MED ORDER — SODIUM CHLORIDE 0.9 % IV SOLN: Freq: Once | INTRAVENOUS | Status: AC

## 2019-08-19 MED ORDER — SODIUM BICARBONATE 8.4 % IV SOLN
INTRAVENOUS | Status: AC | PRN
  Administered 2019-08-19 (×2): 50 meq via INTRAVENOUS

## 2019-08-19 MED ORDER — PANTOPRAZOLE SODIUM 40 MG IV SOLR
40.0000 mg | Freq: Once | INTRAVENOUS | Status: AC
  Administered 2019-08-19: 40 mg via INTRAVENOUS
  Filled 2019-08-19: qty 40

## 2019-08-19 MED ORDER — ONDANSETRON HCL 4 MG/2ML IJ SOLN
4.0000 mg | Freq: Once | INTRAMUSCULAR | Status: AC
  Administered 2019-08-19: 4 mg via INTRAVENOUS
  Filled 2019-08-19: qty 2

## 2019-08-19 MED ORDER — EPINEPHRINE 1 MG/10ML IJ SOSY
PREFILLED_SYRINGE | INTRAMUSCULAR | Status: AC | PRN
  Administered 2019-08-19 (×2): 1 mg via INTRAVENOUS

## 2019-08-19 MED ORDER — IOHEXOL 300 MG/ML  SOLN
75.0000 mL | Freq: Once | INTRAMUSCULAR | Status: AC | PRN
  Administered 2019-08-19: 75 mL via INTRAVENOUS

## 2019-08-19 NOTE — Code Documentation (Signed)
Pt being bagged at this time.  Pt with agonal resp.

## 2019-08-19 NOTE — ED Provider Notes (Addendum)
[facility] Emergency Department Provider Note  ____________________________________________   First MD Initiated Contact with Patient 08/29/2019 1607     (approximate)  I have reviewed the triage vital signs and the nursing notes.   HISTORY  Chief Complaint Emesis    HPI Kim Nunez is a 74 y.o. female  With PMHx HTN, HLD, DM, dementia, here with emesis. History limited 2/2 dementia. Per report from EMS and facility, pt awoke this AM and has been vomiting initially non blood emesis. She has been increasingly confused and drowsy and now is having what is reported as coffee ground emesis. No known h/o peptic ulcer disease. She had not c/o any pain to the facility. Normally, the pt is awake and responsive but has been less responsive today.   Level 5 caveat invoked as remainder of history, ROS, and physical exam limited due to patient's dementia/confusion.    Past Medical History:  Diagnosis Date  . Breast cancer ([place])   . Constipation   . Dementia ([place])    MILD PER FACILITY  . Developmental delay   . Diabetes mellitus type 2 with complications, uncontrolled ([place])   . Hearing loss   . Hyperlipidemia   . Hyperparathyroidism ([place])   . Hypertension     Patient Active Problem List   Diagnosis Date Noted  . Left renal mass 02/14/2019  . Hyperglycemia 09/15/2018    Past Surgical History:  Procedure Laterality Date  . IR RADIOLOGIST EVAL & MGMT  12/07/2018  . IR RADIOLOGIST EVAL & MGMT  03/07/2019  . IR RADIOLOGIST EVAL & MGMT  05/24/2019  . RADIOLOGY WITH ANESTHESIA Left 02/14/2019   Procedure: CT WITH ANESTHESIA CRYO ABLATION AND BIOPSY;  Surgeon: Aletta Edouard, MD;  Location: [facility];  Service: Radiology;  Laterality: Left;  . right breast masectomy      Prior to Admission medications   Medication Sig Start Date End Date Taking? Authorizing Provider  acetaminophen (TYLENOL) 650 MG CR tablet Take 650 mg by mouth 2 (two) times daily.    Yes  [provider]  aspirin EC 81 MG tablet Take 81 mg by mouth daily. 07/09/08  Yes [provider]  cholecalciferol (VITAMIN D3) 25 MCG (1000 UNIT) tablet Take 1,000 Units by mouth daily.   Yes [provider]  furosemide (LASIX) 20 MG tablet Take 20 mg by mouth daily.   Yes [provider]  insulin aspart (NOVOLOG) 100 UNIT/ML injection Inject 5 Units into the skin 3 (three) times daily before meals. Hold if blood sugar is less than 200   Yes [provider]  insulin glargine (LANTUS) 100 UNIT/ML injection Inject 0.17 mLs (17 Units total) into the skin daily. Patient taking differently: Inject 15 Units into the skin daily.  09/23/18  Yes Gouru, Illene Silver, MD  magnesium oxide (MAG-OX) 400 (241.3 Mg) MG tablet Take 400 mg by mouth 2 (two) times daily. 09/09/18  Yes [provider]  metFORMIN (GLUCOPHAGE) 500 MG tablet Take 1,000 mg by mouth 2 (two) times a day. 07/06/16  Yes [provider]  polyethylene glycol (MIRALAX / GLYCOLAX) 17 g packet Take 17 g by mouth daily as needed for mild constipation or moderate constipation.  08/15/17  Yes [provider]  simethicone (MYLICON) 80 MG chewable tablet Chew 1 tablet (80 mg total) by mouth 4 (four) times daily. 09/22/18  Yes Gouru, Aruna, MD  Travoprost, BAK Free, (TRAVATAN Z) 0.004 % SOLN ophthalmic solution Place 1 drop into both eyes at  bedtime.  02/19/16  Yes [provider]  trolamine salicylate (ASPERCREME) 10 % cream Apply 1 application topically 2 (two) times daily as needed for muscle pain.   Yes [provider]  Zinc Oxide (DESITIN) 13 % CREA Apply 1 application topically 2 (two) times daily. After toileting.   Yes [provider]    Allergies Atorvastatin  No family history on file.  Social History Social History   Tobacco Use  . Smoking status: Never Smoker  . Smokeless tobacco: Never Used  Substance Use Topics  . Alcohol use: Never  . Drug use:  Never    Review of Systems  Review of Systems  Unable to perform ROS: Dementia     ____________________________________________  PHYSICAL EXAM:      VITAL SIGNS: ED Triage Vitals  Enc Vitals Group     BP      Pulse      Resp      Temp      Temp src      SpO2      Weight      Height      Head Circumference      Peak Flow      Pain Score      Pain Loc      Pain Edu?      Excl. in [place]?      Physical Exam Vitals and nursing note reviewed.  Constitutional:      General: She is not in acute distress.    Appearance: She is well-developed.  HENT:     Head: Normocephalic and atraumatic.     Mouth/Throat:     Mouth: Mucous membranes are dry.  Eyes:     Conjunctiva/sclera: Conjunctivae normal.  Cardiovascular:     Rate and Rhythm: Normal rate and regular rhythm.     Heart sounds: Normal heart sounds.  Pulmonary:     Effort: Pulmonary effort is normal. No respiratory distress.     Breath sounds: No wheezing.  Abdominal:     General: Abdomen is flat. Bowel sounds are normal. There is distension.     Tenderness: There is generalized abdominal tenderness. There is no guarding or rebound.  Musculoskeletal:     Cervical back: Neck supple.  Skin:    General: Skin is warm.     Capillary Refill: Capillary refill takes less than 2 seconds.     Findings: No rash.  Neurological:     Mental Status: She is disoriented and confused.     Motor: No abnormal muscle tone.     Comments: CN grossly intact. Confused, will withdraw and move to painful stimuli b/l UE and LE.       ____________________________________________   LABS (all labs ordered are listed, but only abnormal results are displayed)  Labs Reviewed  CBC WITH DIFFERENTIAL/PLATELET - Abnormal; Notable for the following components:      Result Value   WBC 25.6 (*)    MCH 25.7 (*)    RDW 15.6 (*)    Platelets 449 (*)    Neutro Abs 21.6 (*)    Monocytes Absolute 1.9 (*)    Abs Immature Granulocytes 0.19 (*)      All other components within normal limits  COMPREHENSIVE METABOLIC PANEL - Abnormal; Notable for the following components:   CO2 17 (*)    Glucose, Bld 281 (*)    BUN 29 (*)    Creatinine, Ser 1.40 (*)    Calcium 10.8 (*)  Total Bilirubin 1.9 (*)    GFR calc non Af Amer 37 (*)    GFR calc Af Amer 43 (*)    Anion gap 16 (*)    All other components within normal limits  URINALYSIS, COMPLETE (UACMP) WITH MICROSCOPIC - Abnormal; Notable for the following components:   Color, Urine AMBER (*)    APPearance TURBID (*)    Glucose, UA 50 (*)    Hgb urine dipstick SMALL (*)    Protein, ur 30 (*)    Leukocytes,Ua LARGE (*)    WBC, UA >50 (*)    Bacteria, UA MANY (*)    All other components within normal limits  LACTIC ACID, PLASMA - Abnormal; Notable for the following components:   Lactic Acid, Venous >11.0 (*)    All other components within normal limits  CULTURE, BLOOD (ROUTINE X 2)  CULTURE, BLOOD (ROUTINE X 2)  URINE CULTURE  SARS CORONAVIRUS 2 (TAT 6-24 HRS)  RESPIRATORY PANEL BY RT PCR (FLU A&B, COVID)  LIPASE, BLOOD  PROTIME-INR  LACTIC ACID, PLASMA  TYPE AND SCREEN  TYPE AND SCREEN    ____________________________________________  EKG: None ________________________________________  RADIOLOGY All imaging, including plain films, CT scans, and ultrasounds, independently reviewed by me, and interpretations confirmed via formal radiology reads.  ED MD interpretation:   CT A/P: Markedly large stool burden, fecal impaction  Official radiology report(s): CT Head Wo Contrast  Result Date: 08/29/2019 CLINICAL DATA:  Encephalopathy EXAM: CT HEAD WITHOUT CONTRAST TECHNIQUE: Contiguous axial images were obtained from the base of the skull through the vertex without intravenous contrast. COMPARISON:  09/16/2018 FINDINGS: Brain: No evidence of acute infarction, hemorrhage, hydrocephalus, extra-axial collection or mass lesion/mass effect. Periventricular white matter hypodensity.  Vascular: No hyperdense vessel or unexpected calcification. Skull: Normal. Negative for fracture or focal lesion. Right shoulder pain. Sinuses/Orbits: No acute finding. Other: None. IMPRESSION: No acute intracranial pathology.  Small-vessel white matter disease. Electronically Signed   By: Eddie Candle M.D.   On: 08/21/2019 18:35   CT ABDOMEN PELVIS W CONTRAST  Result Date: 09/04/2019 CLINICAL DATA:  Vomiting EXAM: CT ABDOMEN AND PELVIS WITH CONTRAST TECHNIQUE: Multidetector CT imaging of the abdomen and pelvis was performed using the standard protocol following bolus administration of intravenous contrast. CONTRAST:  63mL OMNIPAQUE IOHEXOL 300 MG/ML  SOLN COMPARISON:  05/23/2019 FINDINGS: Lower chest: Severely calcified visualized coronary arteries. Heart is normal size. No effusions. Hepatobiliary: Gallbladder is distended. Mild diffuse fatty infiltration of the liver. No focal hepatic abnormality. Pancreas: No focal abnormality or ductal dilatation. Spleen: No focal abnormality.  Normal size. Adrenals/Urinary Tract: Complex cystic lesion off the upper pole of the left kidney again noted measuring 4.2 cm with internal septations and calcifications. This previously measured up to 4.7 cm. No hydronephrosis. Foley catheter within a decompressed bladder. No adrenal mass. Stomach/Bowel: Severely large stool burden throughout the colon. Marked gaseous distention of the colon diffusely. Stomach and small bowel decompressed. Small duodenal diverticulum. Appendix is normal. Vascular/Lymphatic: Aortic atherosclerosis. No enlarged abdominal or pelvic lymph nodes. Reproductive: Uterus and adnexa unremarkable.  No mass. Other: No free fluid or free air. Musculoskeletal: No acute bony abnormality. IMPRESSION: Markedly large stool burden in severe diffuse gaseous distention of the colon. Appearance is compatible with fecal impaction. Gallbladder distention. Diffuse fatty infiltration of the liver. Complex cystic lesion off  the upper pole of the left kidney, decreased in size since prior study. Aortic atherosclerosis.  Coronary artery disease. Electronically Signed   By: Rolm Baptise M.D.  On: 09/09/2019 18:40   DG Chest Portable 1 View  Result Date: 09/14/2019 CLINICAL DATA:  Emesis, weakness EXAM: PORTABLE CHEST 1 VIEW COMPARISON:  02/14/2019 FINDINGS: The heart size and mediastinal contours are within normal limits. Both lungs are clear. The visualized skeletal structures are unremarkable. IMPRESSION: No acute abnormality of the lungs in AP portable projection. Electronically Signed   By: Eddie Candle M.D.   On: 09/04/2019 17:27    ____________________________________________  PROCEDURES   Procedure(s) performed (including Critical Care):  .Critical Care Performed by: Duffy Bruce, MD Authorized by: Duffy Bruce, MD   Critical care provider statement:    Critical care time (minutes):  55   Critical care time was exclusive of:  Separately billable procedures and treating other patients and teaching time   Critical care was necessary to treat or prevent imminent or life-threatening deterioration of the following conditions:  Cardiac failure, circulatory failure, respiratory failure and sepsis   Critical care was time spent personally by me on the following activities:  Development of treatment plan with patient or surrogate, discussions with consultants, evaluation of patient's response to treatment, examination of patient, obtaining history from patient or surrogate, ordering and performing treatments and interventions, ordering and review of laboratory studies, ordering and review of radiographic studies, pulse oximetry, re-evaluation of patient's condition and review of old charts   I assumed direction of critical care for this patient from another provider in my specialty: no    Fecal disimpaction  Date/Time: 09/02/2019 8:50 PM Performed by: Duffy Bruce, MD Authorized by: Duffy Bruce, MD    Consent: Verbal consent obtained. Consent given by: power of attorney Patient understanding: patient states understanding of the procedure being performed Patient identity confirmed: arm band Time out: Immediately prior to procedure a "time out" was called to verify the correct patient, procedure, equipment, support staff and site/side marked as required. Preparation: Patient was prepped and draped in the usual sterile fashion. Local anesthesia used: no  Anesthesia: Local anesthesia used: no  Sedation: Patient sedated: no  Patient tolerance: patient tolerated the procedure well with no immediate complications  .1-3 Lead EKG Interpretation Performed by: Duffy Bruce, MD Authorized by: Duffy Bruce, MD     Interpretation: normal     ECG rate:  80s   ECG rate assessment: normal     Rhythm: sinus rhythm     Ectopy: none     Conduction: normal   Comments:     Indication: Sepsis, tachypnea    ____________________________________________  INITIAL IMPRESSION / MDM / ASSESSMENT AND PLAN / ED COURSE  As part of my medical decision making, I reviewed the following data within the [place] notes reviewed and incorporated, Old chart reviewed, Notes from prior ED visits, and [place] Controlled Substance Database       *Kim Nunez was evaluated in Emergency Department on 09/06/2019 for the symptoms described in the history of present illness. She was evaluated in the context of the global COVID-19 pandemic, which necessitated consideration that the patient might be at risk for infection with the SARS-CoV-2 virus that causes COVID-19. Institutional protocols and algorithms that pertain to the evaluation of patients at risk for COVID-19 are in a state of rapid change based on information released by regulatory bodies including the CDC and federal and state organizations. These policies and algorithms were followed during the patient's care in the ED.  Some ED  evaluations and interventions may be delayed as a result of limited staffing during the  pandemic.*     Medical Decision Making:  74 yo F here with abdominal pain and distension. On arrival, pt with >800 cc in bladder concerning for urinary obstruction. Foley placed, tolerated well. Initial labs show leukocytosis, mild AKI, UA c/w UTI so pt started on rocephin, 30 cc/kg fluid protocol. Given her abd pain, LA was sent and returned at >11. CT ordered immediately and surgery notified.  Taken to CT which shows massive fecal impaction. No free air or perforations. However, pt with marked leukocytosis and lactic acidosis here. She was initially given Rocephin for possible UTI, with addition of flagyl for intra-abd coverage. 30 cc/kg fluids given. LA repeated but unclear if this is accurate as it was drawn above fluid line - will repeat. Discussed with Dr. Peyton Najjar, who is actively in [place] and therefore will be unable to urgently evaluate the pt. I subsequently called Dr. Bobbye Morton at [facility], who is willing to accept. Will send ED to ED for emergent evaluation. She appears to be HD stable though BP remains oft. I discussed my concerns for possible stercoral colitis/ischemia with family, who confirms she would want short-term operation/resuscitation if needed. Given the risks of delayed evaluation by surgery here due to our on-call MD being in [place], feel the benefits of more urgent transfer outweigh benefits.  Of note, while discussing with Dr. Bobbye Morton, she recommended fecal disimpaction attempt prior to transport. I confirmed with her this was safe in the current clinical setting. I was able to manually, gently disempact a moderate amount of initially very firm then soft brown stool. No blood. VS remain stable.  As mentioned, this case was discussed with Dr. Peyton Najjar who is actively in the operating room, and unable to directly assess patient. I was able to arrange for transport in <30 min to [facility] so I feel benefits of  [facility]ring to [facility] outweigh the risks. IV ABX, fluids given, cultures sent. Pt mentation seems to be improving at this time. Family aware of grave prognosis.  ADDENDUM: Repeat LA is >11. I called her sister, who is on the way to [facility]. I told her that given the pt's age, comorbidities, LA>11 despite 30 cc/kg fluid resuscitation, pt's prognosis is extremely poor. She confirmed that she would want full scope of treatment short-term at this time, including going to the OR. Repeat VS at transfer HR 85, RR 22, sats 95% on RA, BP 118/57. She remains confused, less responsive than baseilne but did c/o abd pain, requesting water, which is a slight improvement. She was kept NPO.  ____________________________________________  FINAL CLINICAL IMPRESSION(S) / ED DIAGNOSES  Final diagnoses:  Severe sepsis ([place])  Fecal impaction ([facility])  Lactic acidosis     MEDICATIONS GIVEN DURING THIS VISIT:  Medications  metroNIDAZOLE (FLAGYL) IVPB 500 mg (500 mg Intravenous New Bag/Given 08/28/2019 1932)  ondansetron (ZOFRAN) injection 4 mg (4 mg Intravenous Given 08/16/2019 1635)  0.9 %  sodium chloride infusion ( Intravenous Stopped 08/29/2019 1854)  pantoprazole (PROTONIX) injection 40 mg (40 mg Intravenous Given 08/27/2019 1637)  cefTRIAXone (ROCEPHIN) 2 g in sodium chloride 0.9 % 100 mL IVPB (0 g Intravenous Stopped 09/09/2019 1848)  sodium chloride 0.9 % bolus 1,000 mL (0 mLs Intravenous Stopped 08/18/2019 1918)  iohexol (OMNIPAQUE) 300 MG/ML solution 75 mL (75 mLs Intravenous Contrast Given 08/29/2019 1810)  sodium chloride 0.9 % bolus 1,000 mL (1,000 mLs Intravenous New Bag/Given 09/06/2019 1928)  sodium chloride 0.9 % bolus 500 mL (500 mLs Intravenous New Bag/Given 08/16/2019 1928)  ED Discharge Orders    None       Note:  This document was prepared using Dragon voice recognition software and may include unintentional dictation errors.     Duffy Bruce, MD Sep 03, 2019 [phone]

## 2019-08-19 NOTE — ED Triage Notes (Signed)
Pt from [facility], pt usually speaks in complete sentences per EMS, but not able to at this time. Pt with emesis since this am, coffee ground appearance. Abdomen distended.

## 2019-08-19 NOTE — ED Notes (Signed)
This RN attempted blood redraw and 2nd line without success. [place] RN to attempt and lab called to draw blood cultures.

## 2019-08-19 NOTE — ED Notes (Signed)
Date and time results received: 04/04/212039  Test: Lactic Critical Value: >11  Name of Provider Notified: Dr. Valerie Salts ED  Orders Received? Or Actions Taken?: NA

## 2019-08-19 NOTE — Code Documentation (Signed)
Pt in PEA, CPR started

## 2019-08-19 NOTE — ED Notes (Signed)
Pt left with [facility] 

## 2019-08-19 NOTE — ED Notes (Signed)
Second lactic on this pt redrawn as results from initial second lactic are questionable , [place] and [facility] notified

## 2019-08-19 NOTE — ED Notes (Signed)
Family at bedside. 

## 2019-08-19 NOTE — Code Documentation (Signed)
Pulse check,  Pulse present.

## 2019-08-19 NOTE — ED Notes (Signed)
Pt's sister in room. Pt's sister is legal guardian

## 2019-08-19 NOTE — ED Notes (Signed)
Dr. Bobbye Morton at bedside, fecal impaction performed, family updated on plan of care

## 2019-08-19 NOTE — Code Documentation (Signed)
Pt intubated by Dr. Kathrynn Humble with 8.0 endo tube.  Taped at 26 at lip

## 2019-08-19 NOTE — ED Notes (Signed)
Bladder scan done, >999 noted of urine per scan

## 2019-08-19 NOTE — ED Triage Notes (Signed)
Pt comes via [facility] from [place] for GI bleed, fecal impaction, urinary retention and AMS. Upon arrival to the ED, pt arrival to the ED pt was moaning and following responding to voice, this RN was obtaining admission vitals when pt went unresponsive went into PEA  and then lost pulses. CPR initiated.

## 2019-08-19 NOTE — ED Notes (Signed)
Date and time results received: 09/10/2019 6:50 PM  (use smartphrase ".now" to insert current time)  Test: Lactic Critical Value: >11  Name of Provider Notified: [place]  Orders Received? Or Actions Taken?: Actions Taken: NA and NA

## 2019-08-19 NOTE — Code Documentation (Signed)
Phenylephrine 100 mcg given at 1919

## 2019-08-19 NOTE — Progress Notes (Signed)
ABG results out of range, ph: 6.9, Dr. Kathrynn Humble made aware that results will not cross over.

## 2019-08-19 NOTE — ED Notes (Signed)
General Surgery paged to Dr. Kathrynn Humble per his request

## 2019-08-19 NOTE — Progress Notes (Signed)
CODE SEPSIS - PHARMACY COMMUNICATION  **Broad Spectrum Antibiotics should be administered within 1 hour of Sepsis diagnosis**  Time Code Sepsis Called/Page Received: 1722  Antibiotics Ordered: CTX  Time of 1st antibiotic administration: 1756  Additional action taken by pharmacy: [place] Resident 09/13/2019  5:32 PM

## 2019-08-19 NOTE — ED Provider Notes (Addendum)
[facility] EMERGENCY DEPARTMENT Provider Note   CSN: [id number] Arrival date & time: 08/27/2019  2100     History Chief Complaint  Patient presents with   GI Bleeding    Kim Nunez is a 74 y.o. female.  HPI     74 year old female transferred to [facility] from [facility] for surgical consultation.  Patient has history of diabetes.  She is currently residing at a rehab facility and has history of dementia.  She woke up this morning with vomiting, which was coffee-ground in color.  She is confused.  She was seen at [facility] and noted to have UTI and urinary retention with CT scan revealing fecal impaction.  According to the nursing staff patient was moaning at arrival.  EMS had informed her that patient was asking for water and was generally uncomfortable.  Patient did not answer any of the nurses questions, and then became unresponsive.   Past Medical History:  Diagnosis Date   Breast cancer ([place])    Constipation    Dementia ([place])    MILD PER FACILITY   Developmental delay    Diabetes mellitus type 2 with complications, uncontrolled ([place])    Hearing loss    Hyperlipidemia    Hyperparathyroidism ([place])    Hypertension     Patient Active Problem List   Diagnosis Date Noted   Left renal mass 02/14/2019   Hyperglycemia 09/15/2018    Past Surgical History:  Procedure Laterality Date   IR RADIOLOGIST EVAL & MGMT  12/07/2018   IR RADIOLOGIST EVAL & MGMT  03/07/2019   IR RADIOLOGIST EVAL & MGMT  05/24/2019   RADIOLOGY WITH ANESTHESIA Left 02/14/2019   Procedure: CT WITH ANESTHESIA CRYO ABLATION AND BIOPSY;  Surgeon: Aletta Edouard, MD;  Location: [facility] ORS;  Service: Radiology;  Laterality: Left;   right breast masectomy       OB History   No obstetric history on file.     No family history on file.  Social History   Tobacco Use   Smoking status: Never Smoker   Smokeless tobacco: Never Used  Substance Use  Topics   Alcohol use: Never   Drug use: Never    Home Medications Prior to Admission medications   Medication Sig Start Date End Date Taking? Authorizing Provider  acetaminophen (TYLENOL) 650 MG CR tablet Take 650 mg by mouth 2 (two) times daily.     [provider]  aspirin EC 81 MG tablet Take 81 mg by mouth daily. 07/09/08   [provider]  cholecalciferol (VITAMIN D3) 25 MCG (1000 UNIT) tablet Take 1,000 Units by mouth daily.    [provider]  furosemide (LASIX) 20 MG tablet Take 20 mg by mouth daily.    [provider]  insulin aspart (NOVOLOG) 100 UNIT/ML injection Inject 5 Units into the skin 3 (three) times daily before meals. Hold if blood sugar is less than 200    [provider]  insulin glargine (LANTUS) 100 UNIT/ML injection Inject 0.17 mLs (17 Units total) into the skin daily. Patient taking differently: Inject 15 Units into the skin daily.  09/23/18   Gouru, Illene Silver, MD  magnesium oxide (MAG-OX) 400 (241.3 Mg) MG tablet Take 400 mg by mouth 2 (two) times daily. 09/09/18   [provider]  metFORMIN (GLUCOPHAGE) 500 MG tablet Take 1,000 mg by mouth 2 (two) times a day. 07/06/16   [provider]  polyethylene glycol (MIRALAX / GLYCOLAX) 17 g packet  Take 17 g by mouth daily as needed for mild constipation or moderate constipation.  08/15/17   [provider]  simethicone (MYLICON) 80 MG chewable tablet Chew 1 tablet (80 mg total) by mouth 4 (four) times daily. 09/22/18   Gouru, Illene Silver, MD  Travoprost, BAK Free, (TRAVATAN Z) 0.004 % SOLN ophthalmic solution Place 1 drop into both eyes at bedtime.  02/19/16   [provider]  trolamine salicylate (ASPERCREME) 10 % cream Apply 1 application topically 2 (two) times daily as needed for muscle pain.    [provider]  Zinc Oxide (DESITIN) 13 % CREA Apply 1 application topically 2 (two) times daily. After toileting.    [provider]     Allergies    Atorvastatin  Review of Systems   Review of Systems  Unable to perform ROS: Patient unresponsive    Physical Exam Updated Vital Signs BP 124/61    Pulse 91    Temp (!) 97.5 F (36.4 C) (Rectal)    Resp 20    SpO2 100%   Physical Exam Constitutional:      Comments: GCS 3  Eyes:     Extraocular Movements: Extraocular movements intact.     Comments: Pupils are 2 mm and equal  Cardiovascular:     Rate and Rhythm: Bradycardia present.     Comments: Pulseless Pulmonary:     Comments: Agonal respirations Abdominal:     General: There is distension.     Palpations: Abdomen is soft.     ED Results / Procedures / Treatments   Labs (all labs ordered are listed, but only abnormal results are displayed) Labs Reviewed  BLOOD GAS, ARTERIAL  COMPREHENSIVE METABOLIC PANEL  CBC WITH DIFFERENTIAL/PLATELET  PROTIME-INR  LACTIC ACID, PLASMA  LACTIC ACID, PLASMA    EKG EKG Interpretation  Date/Time:  Sunday August 19 2019 21:28:25 EDT Ventricular Rate:  107 PR Interval:    QRS Duration: 124 QT Interval:  287 QTC Calculation: 383 R Axis:   66 Text Interpretation: Sinus tachycardia Prolonged PR interval Nonspecific intraventricular conduction delay new inferior twi Lateral leads are also involved Confirmed by Varney Biles [phone]) on 08/31/2019 10:16:19 PM   Radiology CT Head Wo Contrast  Result Date: 09/09/2019 CLINICAL DATA:  Encephalopathy EXAM: CT HEAD WITHOUT CONTRAST TECHNIQUE: Contiguous axial images were obtained from the base of the skull through the vertex without intravenous contrast. COMPARISON:  09/16/2018 FINDINGS: Brain: No evidence of acute infarction, hemorrhage, hydrocephalus, extra-axial collection or mass lesion/mass effect. Periventricular white matter hypodensity. Vascular: No hyperdense vessel or unexpected calcification. Skull: Normal. Negative for fracture or focal lesion. Right shoulder pain. Sinuses/Orbits: No acute finding. Other: None.  IMPRESSION: No acute intracranial pathology.  Small-vessel white matter disease. Electronically Signed   By: Eddie Candle M.D.   On: 08/16/2019 18:35   CT ABDOMEN PELVIS W CONTRAST  Result Date: 09/03/2019 CLINICAL DATA:  Vomiting EXAM: CT ABDOMEN AND PELVIS WITH CONTRAST TECHNIQUE: Multidetector CT imaging of the abdomen and pelvis was performed using the standard protocol following bolus administration of intravenous contrast. CONTRAST:  29mL OMNIPAQUE IOHEXOL 300 MG/ML  SOLN COMPARISON:  05/23/2019 FINDINGS: Lower chest: Severely calcified visualized coronary arteries. Heart is normal size. No effusions. Hepatobiliary: Gallbladder is distended. Mild diffuse fatty infiltration of the liver. No focal hepatic abnormality. Pancreas: No focal abnormality or ductal dilatation. Spleen: No focal abnormality.  Normal size. Adrenals/Urinary Tract: Complex cystic lesion off the upper pole of the left kidney again noted measuring 4.2 cm with internal  septations and calcifications. This previously measured up to 4.7 cm. No hydronephrosis. Foley catheter within a decompressed bladder. No adrenal mass. Stomach/Bowel: Severely large stool burden throughout the colon. Marked gaseous distention of the colon diffusely. Stomach and small bowel decompressed. Small duodenal diverticulum. Appendix is normal. Vascular/Lymphatic: Aortic atherosclerosis. No enlarged abdominal or pelvic lymph nodes. Reproductive: Uterus and adnexa unremarkable.  No mass. Other: No free fluid or free air. Musculoskeletal: No acute bony abnormality. IMPRESSION: Markedly large stool burden in severe diffuse gaseous distention of the colon. Appearance is compatible with fecal impaction. Gallbladder distention. Diffuse fatty infiltration of the liver. Complex cystic lesion off the upper pole of the left kidney, decreased in size since prior study. Aortic atherosclerosis.  Coronary artery disease. Electronically Signed   By: Rolm Baptise M.D.   On: 08/18/2019  18:40   DG Chest Portable 1 View  Result Date: 08/29/2019 CLINICAL DATA:  Intubation.  Cardiac arrest. EXAM: PORTABLE CHEST 1 VIEW COMPARISON:  Radiograph earlier today. FINDINGS: Endotracheal tube tip is in the right mainstem bronchus. Recommend retraction of approximately 4-5 cm. Enteric tube is in place with tip below the diaphragm, side-port just at the gastroesophageal junction. Advancement is recommended. Lower lung volumes from prior exam. Unchanged heart size and mediastinal contours. Mild bibasilar atelectasis. No pneumothorax or large pleural effusion. Surgical clips in the right axilla. IMPRESSION: 1. Endotracheal tube tip in the right mainstem bronchus. Recommend retraction of approximately 4-5 cm. 2. Enteric tube tip below the diaphragm, side-port just at the gastroesophageal junction. Advancement of at least 3 cm is recommended. 3. Lower lung volumes from earlier this day.  Bibasilar atelectasis. These results were called by telephone at the time of interpretation on 08/16/2019 at 10:25 pm to Dr Varney Biles , who verbally acknowledged these results. Electronically Signed   By: Keith Rake M.D.   On: 08/16/2019 22:26   DG Chest Portable 1 View  Result Date: 09/01/2019 CLINICAL DATA:  Emesis, weakness EXAM: PORTABLE CHEST 1 VIEW COMPARISON:  02/14/2019 FINDINGS: The heart size and mediastinal contours are within normal limits. Both lungs are clear. The visualized skeletal structures are unremarkable. IMPRESSION: No acute abnormality of the lungs in AP portable projection. Electronically Signed   By: Eddie Candle M.D.   On: 08/28/2019 17:27    Procedures CPR  Date/Time: 08/24/2019 11:18 PM Performed by: Varney Biles, MD Authorized by: Varney Biles, MD  CPR Procedure Details:      Amount of time prior to administration of ACLS/BLS (minutes):  0   ACLS/BLS initiated by EMS: No     CPR/ACLS performed in the ED: Yes     Duration of CPR (minutes):  15   Outcome: ROSC obtained      CPR performed via ACLS guidelines under my direct supervision.  See RN documentation for details including defibrillator use, medications, doses and timing. Comments:     2 rounds of CPR with ROSC. Patient given epi, bicarb.  Procedure Name: Intubation Date/Time: 08/16/2019 11:19 PM Performed by: Varney Biles, MD Pre-anesthesia Checklist: Patient identified, Patient being monitored, Emergency Drugs available, Timeout performed and Suction available Oxygen Delivery Method: Ambu bag Preoxygenation: Pre-oxygenation with 100% oxygen Induction Type: Rapid sequence Ventilation: Mask ventilation without difficulty Laryngoscope Size: Glidescope and 3 Grade View: Grade II Tube size: 7.5 mm Number of attempts: 2 Placement Confirmation: ETT inserted through vocal cords under direct vision,  CO2 detector and Breath sounds checked- equal and bilateral Secured at: 26 cm Difficulty Due To: Difficulty was anticipated and Difficult  Airway- due to dentition Comments: Patient did start vomiting while she was getting BVM    .Critical Care Performed by: Varney Biles, MD Authorized by: Varney Biles, MD   Critical care provider statement:    Critical care time (minutes):  12   Critical care was time spent personally by me on the following activities:  Discussions with consultants, evaluation of patient's response to treatment, examination of patient, ordering and performing treatments and interventions, ordering and review of laboratory studies, ordering and review of radiographic studies, pulse oximetry, re-evaluation of patient's condition, obtaining history from patient or surrogate and review of old charts ARTERIAL LINE  Date/Time: 09-12-19 12:28 AM Performed by: Varney Biles, MD Authorized by: Varney Biles, MD   Consent:    Consent obtained:  Emergent situation Universal protocol:    Patient identity confirmed:  Arm band Indications:    Indications: hemodynamic monitoring    Pre-procedure details:    Skin preparation:  2% Chlorhexidine   Preparation: Patient was prepped and draped in sterile fashion   Procedure details:    Location:  L radial   Needle gauge:  18 G   Number of attempts:  2   Transducer: waveform confirmed   Post-procedure details:    Post-procedure:  Biopatch applied and sutured   Patient tolerance of procedure:  Tolerated well, no immediate complications   (including critical care time)  Medications Ordered in ED Medications  phenylephrine 0.4-0.9 MG/10ML-% injection (has no administration in time range)  propofol (DIPRIVAN) 1000 MG/100ML infusion (has no administration in time range)  sodium bicarbonate 150 mEq in sterile water 1,000 mL infusion ( Intravenous New Bag/Given 09/08/2019 2252)  vasopressin (PITRESSIN) 40 Units in sodium chloride 0.9 % 250 mL (0.16 Units/mL) infusion (0 Units/min Intravenous Hold 08/31/2019 2308)  sodium bicarbonate injection ( Intravenous Canceled Entry 09/09/2019 2130)  EPINEPHrine (ADRENALIN) 1 MG/10ML injection (1 mg Intravenous Given 08/27/2019 2123)  norepinephrine (LEVOPHED) injection (50 mcg/kg/min  75.8 kg Intravenous Rate/Dose Change 09/12/2019 2248)  EPINEPHrine (ADRENALIN) 1 MG/10ML injection (1 mg Intravenous Given 09/03/2019 2206)  sodium bicarbonate injection (50 mEq Intravenous Given 08/25/2019 2210)    ED Course  I have reviewed the triage vital signs and the nursing notes.  Pertinent labs & imaging results that were available during my care of the patient were reviewed by me and considered in my medical decision making (see chart for details).  Clinical Course as of Aug 20 3  Mon Aug 20, 2019  0004 Additional family is at the bedside.  They have agreed to compassionate extubation and pure comfort care only.   [AN]    Clinical Course User Index [AN] Varney Biles, MD   MDM Rules/Calculators/A&P                      74 year old comes in a chief complaint of unresponsiveness. She was transferred from  [facility] with diagnosis of fecal impaction, UTI, likely sterocoral colitis and concerns for possible ischemic gut.  Patient's condition declined rapidly once she arrived to the ED.  Nursing staff made me aware of patient rapid decline in mental status and on my assessment she was bradycardic and then pulseless.  CPR was initiated.  We did have ROSC. Discussed case with general surgery about the decline.  They have assessed the patient.  Patient is not a candidate for surgery.  Discussed prognosis with the family and patient is DNR and they are leaning towards comfort care.  They do not want [place] to escalate  care anymore.  Reassessment: Ms. Righetti has passed away at 12:28 AM with her family at the bedside. She is a patient of [facility].  Final Clinical Impression(s) / ED Diagnoses Final diagnoses:  Cardiac arrest [facility])  Metabolic acidosis    Rx / DC Orders ED Discharge Orders    None       Varney Biles, MD 2019/08/24 0005    Varney Biles, MD 08/24/19 [id number]

## 2019-08-19 NOTE — H&P (Signed)
Reason for Consult/Chief Complaint: fecal impaction Consultant: Kem Boroughs, MD  Kim Nunez is an 74 y.o. female.   HPI: 68F initally presented to [facility]. H/o dementia, HTN, HLD, DM, presented with coffee ground emesis and new onset of confusion. I was called by [facility] to accept the transfer due to concern for bowel ischemia given her lactic acidosis and leukocytosis. On arrival to [facility], she was bradycardic and hypotensive, subsequently intubated and sustained cardiac arrest x2.   Past Medical History:  Diagnosis Date  . Breast cancer ([place])   . Constipation   . Dementia ([place])    MILD PER FACILITY  . Developmental delay   . Diabetes mellitus type 2 with complications, uncontrolled ([place])   . Hearing loss   . Hyperlipidemia   . Hyperparathyroidism ([place])   . Hypertension     Past Surgical History:  Procedure Laterality Date  . IR RADIOLOGIST EVAL & MGMT  12/07/2018  . IR RADIOLOGIST EVAL & MGMT  03/07/2019  . IR RADIOLOGIST EVAL & MGMT  05/24/2019  . RADIOLOGY WITH ANESTHESIA Left 02/14/2019   Procedure: CT WITH ANESTHESIA CRYO ABLATION AND BIOPSY;  Surgeon: Aletta Edouard, MD;  Location: [facility];  Service: Radiology;  Laterality: Left;  . right breast masectomy      No family history on file.  Social History:  reports that she has never smoked. She has never used smokeless tobacco. She reports that she does not drink alcohol or use drugs.  Allergies:  Allergies  Allergen Reactions  . Atorvastatin Other (See Comments)    Muscle pain Muscle pain     Medications: I have reviewed the patient's current medications.  Results for orders placed or performed during the hospital encounter of 09/12/2019 (from the past 48 hour(s))  CBC with Differential     Status: Abnormal   Collection Time: 08/26/2019  4:16 PM  Result Value Ref Range   WBC 25.6 (H) 4.0 - 10.5 K/uL   RBC 5.05 3.87 - 5.11 MIL/uL   Hemoglobin 13.0 12.0 - 15.0 g/dL   HCT 42.3 36.0 - 46.0 %   MCV 83.8 80.0 - 100.0 fL    MCH 25.7 (L) 26.0 - 34.0 pg   MCHC 30.7 30.0 - 36.0 g/dL   RDW 15.6 (H) 11.5 - 15.5 %   Platelets 449 (H) 150 - 400 K/uL   nRBC 0.0 0.0 - 0.2 %   Neutrophils Relative % 84 %   Neutro Abs 21.6 (H) 1.7 - 7.7 K/uL   Lymphocytes Relative 7 %   Lymphs Abs 1.8 0.7 - 4.0 K/uL   Monocytes Relative 8 %   Monocytes Absolute 1.9 (H) 0.1 - 1.0 K/uL   Eosinophils Relative 0 %   Eosinophils Absolute 0.0 0.0 - 0.5 K/uL   Basophils Relative 0 %   Basophils Absolute 0.1 0.0 - 0.1 K/uL   WBC Morphology TOXIC GRANULATION     Comment: VACUOLATED NEUTROPHILS   RBC Morphology MORPHOLOGY UNREMARKABLE    Smear Review Normal platelet morphology    Immature Granulocytes 1 %   Abs Immature Granulocytes 0.19 (H) 0.00 - 0.07 K/uL    Comment: Performed at [facility], [address] [place], [place] [address]  Comprehensive metabolic panel     Status: Abnormal   Collection Time: 08/19/2019  4:16 PM  Result Value Ref Range   Sodium 140 135 - 145 mmol/L   Potassium 4.6 3.5 - 5.1 mmol/L   Chloride 107 98 - 111 mmol/L   CO2  17 (L) 22 - 32 mmol/L   Glucose, Bld 281 (H) 70 - 99 mg/dL    Comment: Glucose reference range applies only to samples taken after fasting for at least 8 hours.   BUN 29 (H) 8 - 23 mg/dL   Creatinine, Ser 1.40 (H) 0.44 - 1.00 mg/dL   Calcium 10.8 (H) 8.9 - 10.3 mg/dL   Total Protein 7.3 6.5 - 8.1 g/dL   Albumin 3.9 3.5 - 5.0 g/dL   AST 31 15 - 41 U/L   ALT 20 0 - 44 U/L   Alkaline Phosphatase 87 38 - 126 U/L   Total Bilirubin 1.9 (H) 0.3 - 1.2 mg/dL   GFR calc non Af Amer 37 (L) >60 mL/min   GFR calc Af Amer 43 (L) >60 mL/min   Anion gap 16 (H) 5 - 15    Comment: Performed at [facility], [address] [place], [place] [address]  Lipase, blood     Status: None   Collection Time: 08/17/2019  4:16 PM  Result Value Ref Range   Lipase 22 11 - 51 U/L    Comment: Performed at [facility], [address] [place], [place] [address]  Type and screen  [place]     Status: None (Preliminary result)   Collection Time: 09/14/2019  4:16 PM  Result Value Ref Range   ABO/RH(D) PENDING    Antibody Screen PENDING    Sample Expiration      08/22/2019,2359 Performed at [facility], [address] [place], [place] [address]   Urinalysis, Complete w Microscopic     Status: Abnormal   Collection Time: 08/22/2019  4:16 PM  Result Value Ref Range   Color, Urine AMBER (A) YELLOW    Comment: BIOCHEMICALS MAY BE AFFECTED BY COLOR   APPearance TURBID (A) CLEAR   Specific Gravity, Urine 1.010 1.005 - 1.030   pH 5.0 5.0 - 8.0   Glucose, UA 50 (A) NEGATIVE mg/dL   Hgb urine dipstick SMALL (A) NEGATIVE   Bilirubin Urine NEGATIVE NEGATIVE   Ketones, ur NEGATIVE NEGATIVE mg/dL   Protein, ur 30 (A) NEGATIVE mg/dL   Nitrite NEGATIVE NEGATIVE   Leukocytes,Ua LARGE (A) NEGATIVE   RBC / HPF 0-5 0 - 5 RBC/hpf   WBC, UA >50 (H) 0 - 5 WBC/hpf   Bacteria, UA MANY (A) NONE SEEN   Squamous Epithelial / LPF 0-5 0 - 5   WBC Clumps PRESENT    Mucus PRESENT    Hyaline Casts, UA PRESENT     Comment: Performed at [facility], [address] [place], [place] [address]  Protime-INR     Status: None   Collection Time: 08/28/2019  5:21 PM  Result Value Ref Range   Prothrombin Time 14.4 11.4 - 15.2 seconds   INR 1.1 0.8 - 1.2    Comment: (NOTE) INR goal varies based on device and disease states. Performed at [facility], [address] [place], [place] [address]   Type and screen     Status: None   Collection Time: 09/09/2019  5:21 PM  Result Value Ref Range   ABO/RH(D) O POS    Antibody Screen NEG    Sample Expiration      08/22/2019,2359 Performed at [facility], [address] [place], [place] [address]   Lactic acid, plasma     Status: Abnormal   Collection Time: 08/19/19  5:21 PM  Result Value Ref Range  Lactic Acid, Venous >11.0 (HH) 0.5 - 1.9 mmol/L    Comment: CRITICAL RESULT CALLED  TO, READ BACK BY AND VERIFIED WITH MAUREEN TUMEY ON 08/30/2019 AT 1850 [facility] Performed at [facility], [address], [place], [place] [address]   Respiratory Panel by RT PCR (Flu A&B, Covid) - Nasopharyngeal Swab     Status: None   Collection Time: 09/09/2019  7:20 PM   Specimen: Nasopharyngeal Swab  Result Value Ref Range   SARS Coronavirus 2 by RT PCR NEGATIVE NEGATIVE    Comment: (NOTE) SARS-CoV-2 target nucleic acids are NOT DETECTED. The SARS-CoV-2 RNA is generally detectable in upper respiratoy specimens during the acute phase of infection. The lowest concentration of SARS-CoV-2 viral copies this assay can detect is 131 copies/mL. A negative result does not preclude SARS-Cov-2 infection and should not be used as the sole basis for treatment or other patient management decisions. A negative result may occur with  improper specimen collection/handling, submission of specimen other than nasopharyngeal swab, presence of viral mutation(s) within the areas targeted by this assay, and inadequate number of viral copies (<131 copies/mL). A negative result must be combined with clinical observations, patient history, and epidemiological information. The expected result is Negative. Fact Sheet for Patients:  PinkCheek.be Fact Sheet for Healthcare Providers:  GravelBags.it This test is not yet ap proved or cleared by the [place] FDA and  has been authorized for detection and/or diagnosis of SARS-CoV-2 by FDA under an Emergency Use Authorization (EUA). This EUA will remain  in effect (meaning this test can be used) for the duration of the COVID-19 declaration under Section 564(b)(1) of the Act, 21 U.S.C. section 360bbb-3(b)(1), unless the authorization is terminated or revoked sooner.    Influenza A by PCR NEGATIVE NEGATIVE   Influenza B by PCR NEGATIVE NEGATIVE    Comment: (NOTE) The Xpert Xpress SARS-CoV-2/FLU/RSV  assay is intended as an aid in  the diagnosis of influenza from Nasopharyngeal swab specimens and  should not be used as a sole basis for treatment. Nasal washings and  aspirates are unacceptable for Xpert Xpress SARS-CoV-2/FLU/RSV  testing. Fact Sheet for Patients: PinkCheek.be Fact Sheet for Healthcare Providers: GravelBags.it This test is not yet approved or cleared by the [place] FDA and  has been authorized for detection and/or diagnosis of SARS-CoV-2 by  FDA under an Emergency Use Authorization (EUA). This EUA will remain  in effect (meaning this test can be used) for the duration of the  Covid-19 declaration under Section 564(b)(1) of the Act, 21  U.S.C. section 360bbb-3(b)(1), unless the authorization is  terminated or revoked. Performed at [facility], [address] [place], [place] [address]   Lactic acid, plasma     Status: None   Collection Time: 08/24/2019  7:21 PM  Result Value Ref Range   Lactic Acid, Venous 1.0 0.5 - 1.9 mmol/L    Comment: Performed at [facility], [address] [place], [place] [address]  Lactic acid, plasma     Status: Abnormal   Collection Time: 09/10/2019  8:16 PM  Result Value Ref Range   Lactic Acid, Venous >11.0 (HH) 0.5 - 1.9 mmol/L    Comment: CRITICAL RESULT CALLED TO, READ BACK BY AND VERIFIED WITH MAUREEN TUMEY @2039  08/21/2019 [facility] Performed at [facility], [address] [place], [place] [address]     CT Head Wo Contrast  Result Date: 09/04/2019 CLINICAL DATA:  Encephalopathy EXAM: CT HEAD WITHOUT CONTRAST TECHNIQUE: Contiguous axial images were  obtained from the base of the skull through the vertex without intravenous contrast. COMPARISON:  09/16/2018 FINDINGS: Brain: No evidence of acute infarction, hemorrhage, hydrocephalus, extra-axial collection or mass lesion/mass effect. Periventricular white matter hypodensity. Vascular: No hyperdense  vessel or unexpected calcification. Skull: Normal. Negative for fracture or focal lesion. Right shoulder pain. Sinuses/Orbits: No acute finding. Other: None. IMPRESSION: No acute intracranial pathology.  Small-vessel white matter disease. Electronically Signed   By: Eddie Candle M.D.   On: 09/01/2019 18:35   CT ABDOMEN PELVIS W CONTRAST  Result Date: 08/21/2019 CLINICAL DATA:  Vomiting EXAM: CT ABDOMEN AND PELVIS WITH CONTRAST TECHNIQUE: Multidetector CT imaging of the abdomen and pelvis was performed using the standard protocol following bolus administration of intravenous contrast. CONTRAST:  39mL OMNIPAQUE IOHEXOL 300 MG/ML  SOLN COMPARISON:  05/23/2019 FINDINGS: Lower chest: Severely calcified visualized coronary arteries. Heart is normal size. No effusions. Hepatobiliary: Gallbladder is distended. Mild diffuse fatty infiltration of the liver. No focal hepatic abnormality. Pancreas: No focal abnormality or ductal dilatation. Spleen: No focal abnormality.  Normal size. Adrenals/Urinary Tract: Complex cystic lesion off the upper pole of the left kidney again noted measuring 4.2 cm with internal septations and calcifications. This previously measured up to 4.7 cm. No hydronephrosis. Foley catheter within a decompressed bladder. No adrenal mass. Stomach/Bowel: Severely large stool burden throughout the colon. Marked gaseous distention of the colon diffusely. Stomach and small bowel decompressed. Small duodenal diverticulum. Appendix is normal. Vascular/Lymphatic: Aortic atherosclerosis. No enlarged abdominal or pelvic lymph nodes. Reproductive: Uterus and adnexa unremarkable.  No mass. Other: No free fluid or free air. Musculoskeletal: No acute bony abnormality. IMPRESSION: Markedly large stool burden in severe diffuse gaseous distention of the colon. Appearance is compatible with fecal impaction. Gallbladder distention. Diffuse fatty infiltration of the liver. Complex cystic lesion off the upper pole of the left  kidney, decreased in size since prior study. Aortic atherosclerosis.  Coronary artery disease. Electronically Signed   By: Rolm Baptise M.D.   On: 08/28/2019 18:40   DG Chest Portable 1 View  Result Date: 08/31/2019 CLINICAL DATA:  Intubation.  Cardiac arrest. EXAM: PORTABLE CHEST 1 VIEW COMPARISON:  Radiograph earlier today. FINDINGS: Endotracheal tube tip is in the right mainstem bronchus. Recommend retraction of approximately 4-5 cm. Enteric tube is in place with tip below the diaphragm, side-port just at the gastroesophageal junction. Advancement is recommended. Lower lung volumes from prior exam. Unchanged heart size and mediastinal contours. Mild bibasilar atelectasis. No pneumothorax or large pleural effusion. Surgical clips in the right axilla. IMPRESSION: 1. Endotracheal tube tip in the right mainstem bronchus. Recommend retraction of approximately 4-5 cm. 2. Enteric tube tip below the diaphragm, side-port just at the gastroesophageal junction. Advancement of at least 3 cm is recommended. 3. Lower lung volumes from earlier this day.  Bibasilar atelectasis. These results were called by telephone at the time of interpretation on 09/05/2019 at 10:25 pm to Dr Varney Biles , who verbally acknowledged these results. Electronically Signed   By: Keith Rake M.D.   On: 09/13/2019 22:26   DG Chest Portable 1 View  Result Date: 08/16/2019 CLINICAL DATA:  Emesis, weakness EXAM: PORTABLE CHEST 1 VIEW COMPARISON:  02/14/2019 FINDINGS: The heart size and mediastinal contours are within normal limits. Both lungs are clear. The visualized skeletal structures are unremarkable. IMPRESSION: No acute abnormality of the lungs in AP portable projection. Electronically Signed   By: Eddie Candle M.D.   On: 08/16/2019 17:27    ROS 10 point review of  systems is negative except as listed above in HPI.   Physical Exam Blood pressure 124/61, pulse 91, temperature (!) 97.5 F (36.4 C), temperature source Rectal, resp.  rate 20, SpO2 100 %. Constitutional: well-developed, well-nourished HEENT: moist conjunctiva, external inspection of ears and nose normal, hearing unable to be assessed due to clinical condition Oropharynx: normal oropharyngeal mucosa, poor dentition Neck: no thyromegaly, trachea midline, unable to assess midline cervical tenderness to palpation Chest: breath sounds equal bilaterally, mechanically ventilated, no midline or lateral chest wall deformity Abdomen: soft, NT, no rebound/guarding, distended, no bruising, no hepatosplenomegaly GU: normal female genitalia , foley in place Back: no wounds, unable to assess thoracic/lumbar spine tenderness to palpation, no thoracic/lumbar spine stepoffs Rectal: poor tone, copious soft brown stool incompletely disimpacted Extremities: 2+ radial and pedal pulses bilaterally, motor and sensation intact to bilateral UE and LE unable to be assessed due to clinical condition, no peripheral edema MSK: unable to assess gait/station, no clubbing/cyanosis of fingers/toes, unable to assess ROM of all four extremities Skin: warm, dry, no rashes    Assessment/Plan: 7F with massive fecal impaction, concern for bowel ischemia, now s/p cardiac arrest x2. At the time of my evaluation, she was unresponsive to noxious stimulus without the presence of confounding analgesic/sedative and was on 54mcg of levophed with SBP 40s-50s. Given her current clinical state, I do not think surgical intervention is warranted as I think the patient would arrest en route to the OR or on the operating room table. If she were survive the surgery, the likelihood of her surviving this hospitalization approaches zero in my opinion. I expressed my concerns to the patient's sister who was at the bedside who verbalized understanding and would not like to undergo aggressive intervention without meaningful positive contribution to outcome. We discussed transitioning to comfort care without de-escalation of  care as she would like for her family members who are on the way to the hospital to be with her and the patient. We will continue to provide supportive care to the patient and emotional support to her family.    Jesusita Oka, MD [facility]

## 2019-08-19 NOTE — ED Notes (Signed)
Pt more alert than on arrival to ED. Asking for water, pt told she cannot have water at this time. Pt's mouth swabbed with lemon swabs and mouth moisturizer applied. Sister at bedside. EDP updated pt's sister at bedside.

## 2019-08-19 NOTE — ED Notes (Signed)
Pt put on O2 2L due to increased work of breathing per EDP.

## 2019-08-19 NOTE — ED Notes (Signed)
Sister took pt's clothes, report called to [facility], Callie RN Pt's sister is pt's legal guardian and POA, pt's sister Conley Canal gave verbal consent to transfer pt.

## 2019-08-19 NOTE — Code Documentation (Signed)
Pulses lost, CPR started.

## 2019-08-19 NOTE — ED Notes (Signed)
Pt to CT

## 2019-08-20 LAB — SARS CORONAVIRUS 2 (TAT 6-24 HRS): SARS Coronavirus 2: NEGATIVE

## 2019-08-20 MED ORDER — MORPHINE SULFATE (PF) 4 MG/ML IV SOLN
8.0000 mg | Freq: Once | INTRAVENOUS | Status: AC
  Administered 2019-08-20: 8 mg via INTRAVENOUS
  Filled 2019-08-20: qty 2

## 2019-08-20 MED FILL — Medication: Qty: 1 | Status: AC

## 2019-08-22 LAB — URINE CULTURE: Culture: >=100000 — AB

## 2019-08-31 LAB — CULTURE, BLOOD (ROUTINE X 2)
Culture: NO GROWTH
Culture: NO GROWTH
Special Requests: ADEQUATE
Special Requests: ADEQUATE

## 2019-09-15 NOTE — Progress Notes (Signed)
Pt. Extubated per order and per protocol.

## 2019-09-15 NOTE — ED Notes (Addendum)
Time of death 0028, family present at bedside

## 2019-09-15 NOTE — Progress Notes (Signed)
Chaplain responded to page from the Emergency Department. The chaplain provided supportive presence and prayer for a family facing a death. The chaplain also provided information regarding patient placement. The chaplain is no longer needed.  Brion Aliment Chaplain Resident For questions concerning this note please contact me by pager [phone]

## 2019-09-15 NOTE — ED Notes (Signed)
Pt extubated, family and chaplin at bedside

## 2019-09-15 NOTE — ED Notes (Signed)
Family made decision for comfort care, pt to be extubated

## 2019-09-15 DEATH — deceased

## 2020-10-04 IMAGING — CT CT ABDOMEN WO/W CM
3 of 12 series · 11 of 46 positions shown, 17 images · IV contrast (omnipaque)
Comparison: 09/17/2018

CLINICAL DATA: Status post left renal cryoablation and biopsy

EXAM:
CT ABDOMEN WITHOUT AND WITH CONTRAST
TECHNIQUE: Multidetector CT imaging of the abdomen was performed following the
standard protocol before and following the bolus administration of
intravenous contrast.
CONTRAST:  100mL OMNIPAQUE IOHEXOL 300 MG/ML  SOLN

[Series 4: coronal pre · coronal · non-contrast · 0.65mm/px · 2 of 94 slices shown, 3 images]
[im 32/94  soft-tissue]
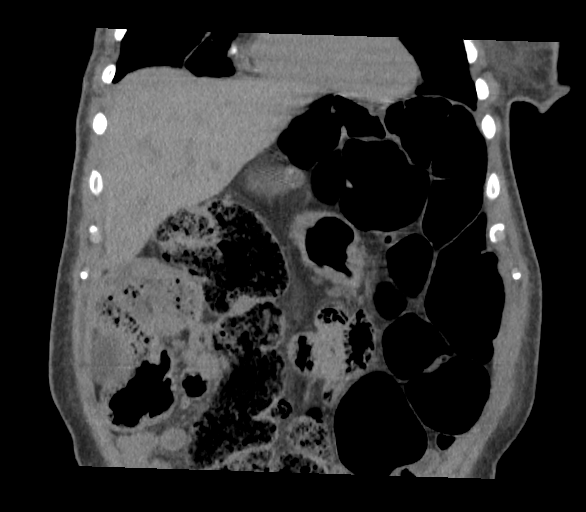
[im 32/94  bone]
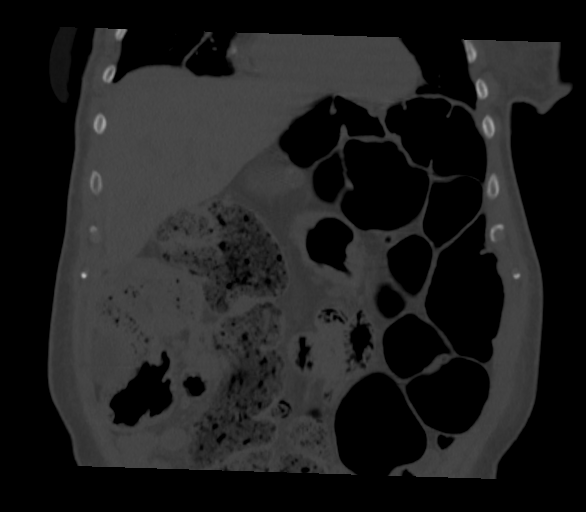
[im 63/94  soft-tissue]
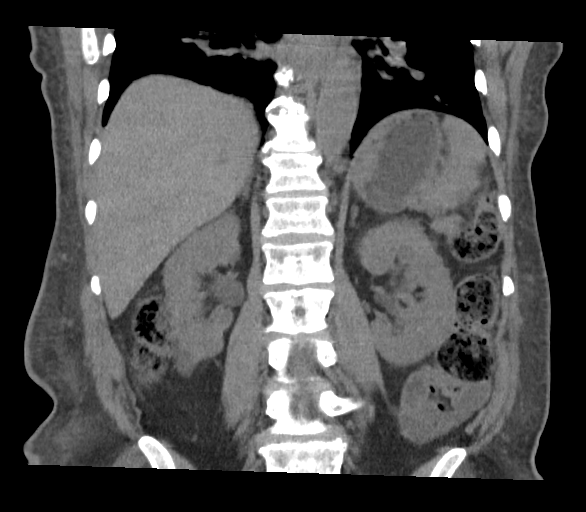

[Series 8: axial arterial · axial · arterial · 0.80mm/px · z∈[-209,+7]mm · 7 of 98 slices shown, 12 images]
[im 13/98  soft-tissue]
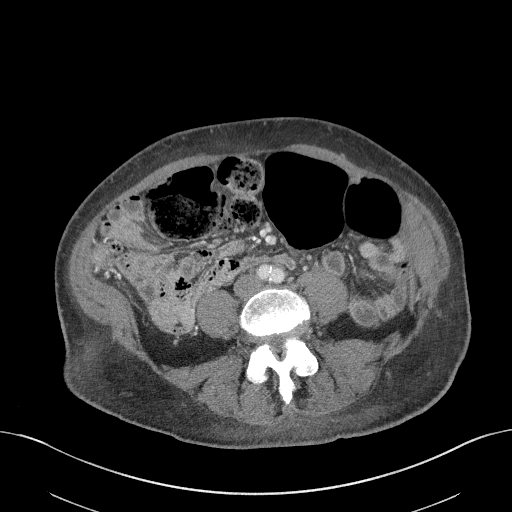
[im 13/98  bone]
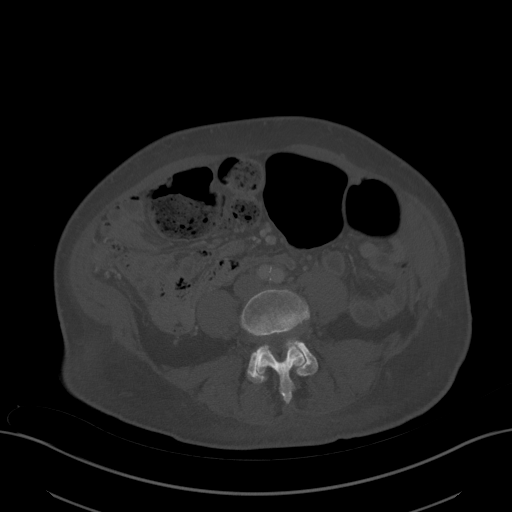
[im 25/98  soft-tissue]
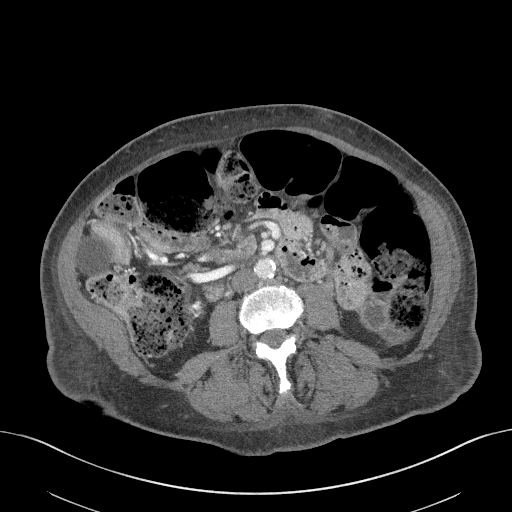
[im 37/98  soft-tissue]
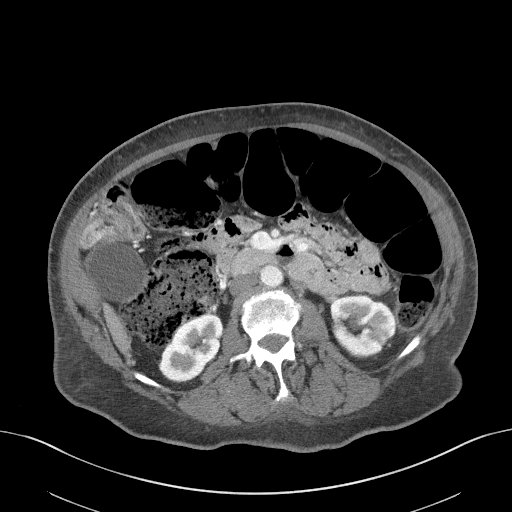
[im 49/98  soft-tissue]
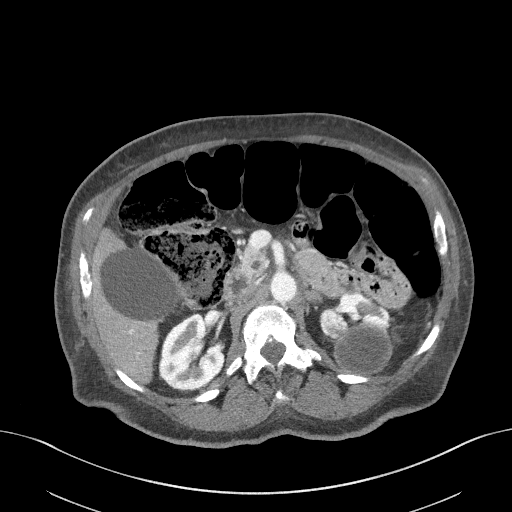
[im 49/98  lung]
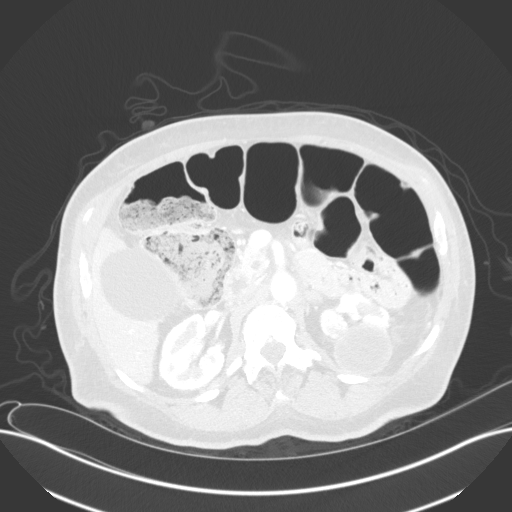
[im 61/98  soft-tissue]
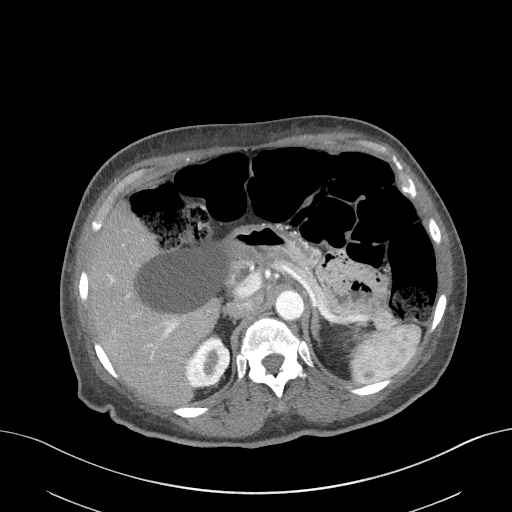
[im 61/98  lung]
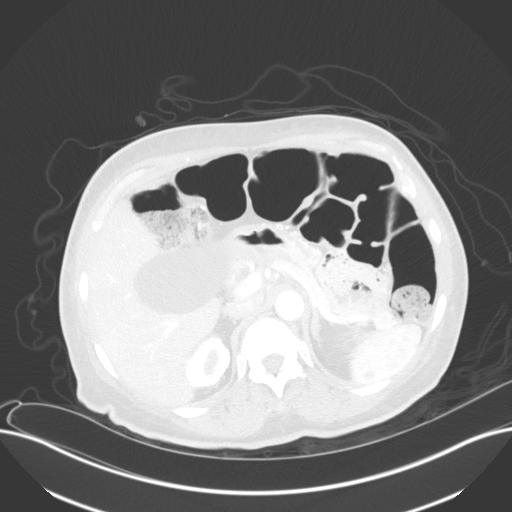
[im 73/98  soft-tissue]
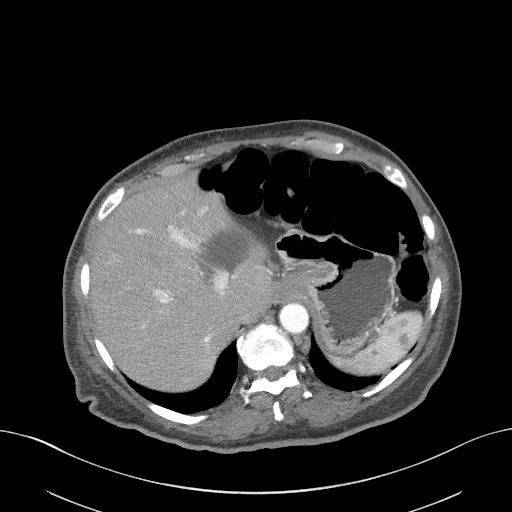
[im 73/98  lung]
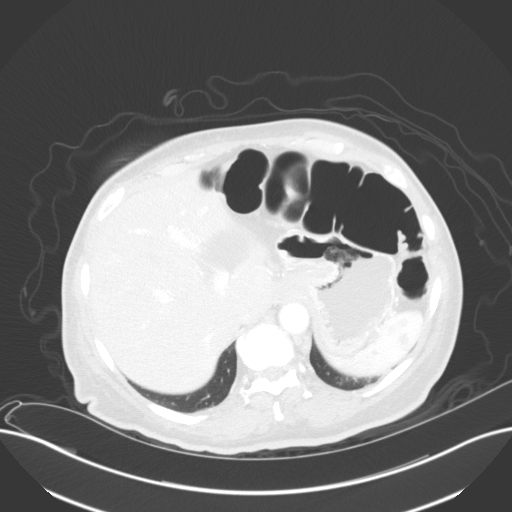
[im 85/98  soft-tissue]
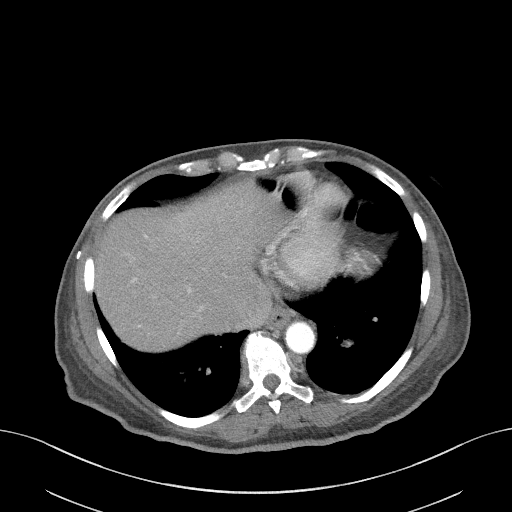
[im 85/98  lung]
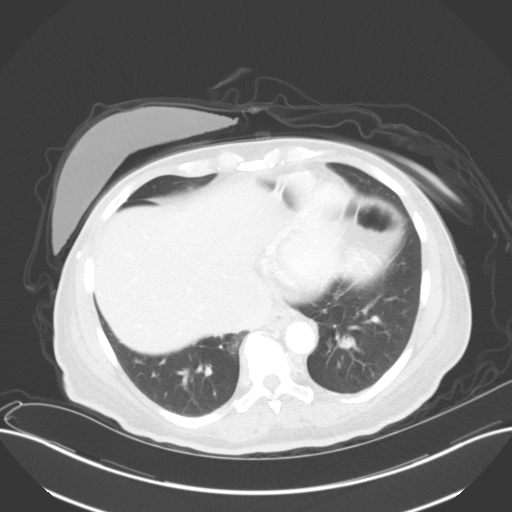

[Series 11: axial nephrographic · axial · 0.80mm/px · z∈[-200,-164]mm · 2 of 94 slices shown]
[im 12/94  soft-tissue]
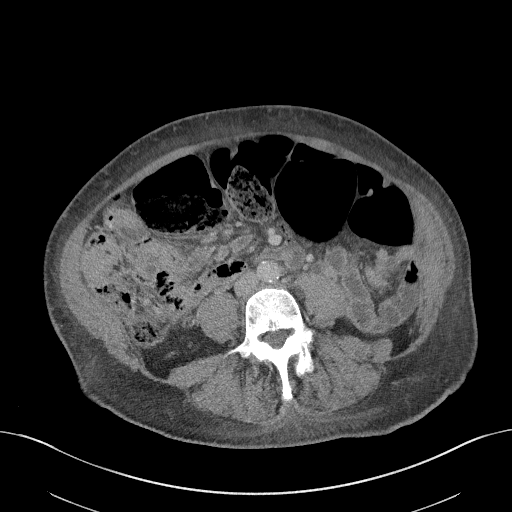
[im 24/94  soft-tissue]
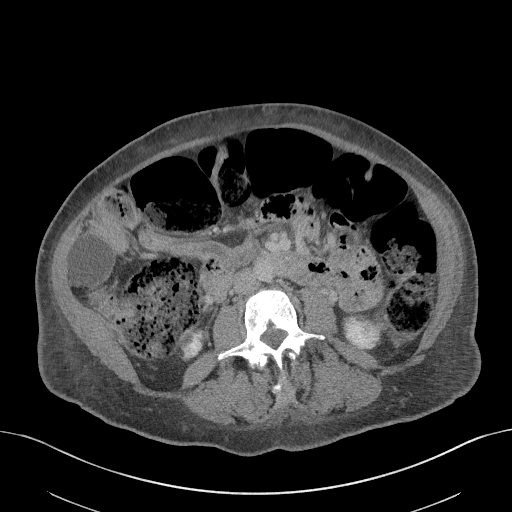

[11 of 46 positions shown; findings below may reference images not displayed]

FINDINGS: Lower chest: Tiny pulmonary nodule in the right lung base (image 13,
series 3) approximately 4 mm is unchanged compared to prior study.

Hepatobiliary: Gallbladder is distended and has been shown to be
distended on prior studies with similar appearance. No signs of
biliary ductal dilation.

Pancreas: Pancreas is atrophic as before.

Spleen: Spleen with low-density foci, not definitely seen on prior
studies though suggested on the MRI evaluation of 09/20/2018. The
first phase of enhancement on that study is the closest to the late
arterial phase on the current exam.

Adrenals/Urinary Tract: Adrenal glands are normal. Signs of renal
cortical scarring.

Left renal mass post cryoablation measuring 4.7 x 4.1 cm, thicker
peripheral rim on the current study than on the prior, size on the
previous exam [DATE] x 3.9 cm.

Thick peripheral rim is dense prior to contrast administration
perhaps some delayed enhancement along the rim of this lesion on
later phase. No signs of nodular enhancement. Central calcifications
are demonstrated. No overt internal enhancement. No additional
suspicious renal lesion.

Stomach/Bowel: Moderate colonic distension, gas and stool filled
with unchanged appearance. Appendix is normal. Bowel loops are
incompletely imaged.

Vascular/Lymphatic: Calcified atherosclerotic plaque and
noncalcified plaque mild in the abdominal aorta without signs of
aneurysm. Renal veins are patent.

No signs of retroperitoneal or renal hilar lymphadenopathy.

Other: Mild body wall edema. No signs of ascites.

Musculoskeletal: Signs of right mastectomy. No acute bone finding or
destructive bone process.
IMPRESSION: Post left renal cryoablation with thicker appearing rim surrounding
the lesion on today's study than on the previous likely related to
treatment changes. MRI follow-up could potentially be helpful if
possible for tracking for any changes in this lesion.

Gallbladder distension is similar to prior studies.

Tiny low-density foci in the spleen less apparent on delayed phase
imaging perhaps present previously and not as well seen due to
differences in phase of contrast enhancement. Perhaps benign splenic
lesions such as small splenic hemangiomata the given history of
breast cancer and renal cell carcinoma consider shorter interval
follow-up for this finding at 3 months. Spleen is mildly larger than
on the prior study as well. Also correlate with any history of
sarcoidosis which could have this appearance.

Colonic distension as before.

Aortic Atherosclerosis ([id number]).
